# Patient Record
Sex: Female | Born: 1935 | Race: White | Hispanic: No | Smoking: Never smoker
Health system: Southern US, Community
[De-identification: ages and names within clinical notes are randomized; demographics above are authoritative.]

## PROBLEM LIST (undated history)

## (undated) DIAGNOSIS — E119 Type 2 diabetes mellitus without complications: Secondary | ICD-10-CM

## (undated) DIAGNOSIS — I4891 Unspecified atrial fibrillation: Secondary | ICD-10-CM

## (undated) DIAGNOSIS — I10 Essential (primary) hypertension: Secondary | ICD-10-CM

## (undated) DIAGNOSIS — K219 Gastro-esophageal reflux disease without esophagitis: Secondary | ICD-10-CM

## (undated) DIAGNOSIS — M13811 Other specified arthritis, right shoulder: Secondary | ICD-10-CM

## (undated) DIAGNOSIS — I679 Cerebrovascular disease, unspecified: Secondary | ICD-10-CM

## (undated) DIAGNOSIS — I2510 Atherosclerotic heart disease of native coronary artery without angina pectoris: Secondary | ICD-10-CM

## (undated) DIAGNOSIS — E785 Hyperlipidemia, unspecified: Secondary | ICD-10-CM

## (undated) DIAGNOSIS — I429 Cardiomyopathy, unspecified: Secondary | ICD-10-CM

## (undated) HISTORY — DX: Hyperlipidemia, unspecified: E78.5

## (undated) HISTORY — DX: Cerebrovascular disease, unspecified: I67.9

## (undated) HISTORY — PX: CAROTID STENT INSERTION: SHX5766

## (undated) HISTORY — PX: CHOLECYSTECTOMY: SHX55

## (undated) HISTORY — DX: Gastro-esophageal reflux disease without esophagitis: K21.9

## (undated) HISTORY — DX: Atherosclerotic heart disease of native coronary artery without angina pectoris: I25.10

## (undated) HISTORY — DX: Essential (primary) hypertension: I10

## (undated) HISTORY — PX: CAROTID ENDARTERECTOMY: SUR193

## (undated) HISTORY — DX: Cardiomyopathy, unspecified: I42.9

## (undated) HISTORY — PX: APPENDECTOMY: SHX54

## (undated) HISTORY — PX: CARDIAC CATHETERIZATION: SHX172

---

## 2005-01-14 ENCOUNTER — Ambulatory Visit (HOSPITAL_COMMUNITY): Admission: RE | Admit: 2005-01-14 | Discharge: 2005-01-14 | Payer: Self-pay | Admitting: Internal Medicine

## 2005-01-14 ENCOUNTER — Encounter (INDEPENDENT_AMBULATORY_CARE_PROVIDER_SITE_OTHER): Payer: Self-pay | Admitting: Internal Medicine

## 2005-01-14 ENCOUNTER — Ambulatory Visit: Payer: Self-pay | Admitting: Internal Medicine

## 2008-01-26 ENCOUNTER — Ambulatory Visit: Payer: Self-pay | Admitting: Cardiology

## 2008-01-28 ENCOUNTER — Inpatient Hospital Stay (HOSPITAL_COMMUNITY): Admission: EM | Admit: 2008-01-28 | Discharge: 2008-01-28 | Payer: Self-pay | Admitting: Cardiology

## 2008-01-28 ENCOUNTER — Ambulatory Visit: Payer: Self-pay | Admitting: Cardiology

## 2008-02-17 ENCOUNTER — Ambulatory Visit: Payer: Self-pay | Admitting: Cardiology

## 2008-12-21 DIAGNOSIS — E785 Hyperlipidemia, unspecified: Secondary | ICD-10-CM | POA: Insufficient documentation

## 2008-12-21 DIAGNOSIS — R079 Chest pain, unspecified: Secondary | ICD-10-CM | POA: Insufficient documentation

## 2008-12-21 DIAGNOSIS — I2510 Atherosclerotic heart disease of native coronary artery without angina pectoris: Secondary | ICD-10-CM | POA: Insufficient documentation

## 2009-03-08 ENCOUNTER — Encounter: Payer: Self-pay | Admitting: Physician Assistant

## 2009-03-08 ENCOUNTER — Ambulatory Visit: Payer: Self-pay | Admitting: Cardiology

## 2009-03-08 DIAGNOSIS — I6529 Occlusion and stenosis of unspecified carotid artery: Secondary | ICD-10-CM | POA: Insufficient documentation

## 2009-05-15 ENCOUNTER — Telehealth (INDEPENDENT_AMBULATORY_CARE_PROVIDER_SITE_OTHER): Payer: Self-pay | Admitting: *Deleted

## 2009-05-15 DIAGNOSIS — R002 Palpitations: Secondary | ICD-10-CM | POA: Insufficient documentation

## 2009-05-16 ENCOUNTER — Ambulatory Visit: Payer: Self-pay | Admitting: Cardiology

## 2009-05-24 ENCOUNTER — Encounter: Payer: Self-pay | Admitting: Cardiology

## 2009-05-25 ENCOUNTER — Telehealth (INDEPENDENT_AMBULATORY_CARE_PROVIDER_SITE_OTHER): Payer: Self-pay | Admitting: *Deleted

## 2009-06-01 ENCOUNTER — Ambulatory Visit: Payer: Self-pay | Admitting: Cardiology

## 2009-06-01 DIAGNOSIS — I471 Supraventricular tachycardia: Secondary | ICD-10-CM | POA: Insufficient documentation

## 2009-06-12 ENCOUNTER — Telehealth (INDEPENDENT_AMBULATORY_CARE_PROVIDER_SITE_OTHER): Payer: Self-pay | Admitting: *Deleted

## 2010-01-03 ENCOUNTER — Encounter (INDEPENDENT_AMBULATORY_CARE_PROVIDER_SITE_OTHER): Payer: Self-pay | Admitting: *Deleted

## 2010-04-09 NOTE — Procedures (Signed)
Summary: Holter Monitor-Final Summary  Holter Monitor-Final Summary   Imported By: Cyril Loosen, RN, BSN 05/29/2009 16:28:50  _____________________________________________________________________  External Attachment:    Type:   Image     Comment:   External Document  Appended Document: Holter Monitor-Final Summary Dr. Diona Browner, Based on these results, do you feel pt needs further follow up?  Appended Document: Holter Monitor-Final Summary Yes - we can discuss her palpitations and decide if further medication adjustments are needed.  Appended Document: Holter Monitor-Final Summary Pt notified of results and verbalized understanding. Scheduled pt on Friday, March 25th at 2pm.

## 2010-04-09 NOTE — Assessment & Plan Note (Signed)
Summary: PALPS-DISCUSS HOLTER RESULTS-JM   Visit Type:  Follow-up Primary Provider:  Dr. Lia Hopping   History of Present Illness: 76 year old woman presents for a followup visit. She had called recently reporting an increase in palpitations and was referred for a Holter monitor. This study was reviewed, revealing occasional PVCs and PACs, no NSVT, no pauses, but some bursts of PSVT were noted at approximately 160 beats per minute, the longest being 19 beats.  Today were reviewed these findings. She seems somewhat less bothered by her palpitations, although definitely indicates that they are worse than they used to be. She states she was given a medication in the past to take as needed with palpitations, but cannot recall the name.  We talked about PSVT, and strategies for management, particularly with fairly brief, limited episodes. She is not reporting any significant chest pain or unusual breathlessness.  Preventive Screening-Counseling & Management  Alcohol-Tobacco     Smoking Status: never  Current Medications (verified): 1)  Atenolol-Chlorthalidone 50-25 Mg Tabs (Atenolol-Chlorthalidone) .... Take 1 Tablet By Mouth Once A Day 2)  Multivitamins  Tabs (Multiple Vitamin) .... Take 1 Tablet By Mouth Once A Day 3)  Aspir-Low 81 Mg Tbec (Aspirin) .... Take 1 Tablet By Mouth Once A Day 4)  Fish Oil 1000 Mg Caps (Omega-3 Fatty Acids) .... Take 1 Capsule By Mouth Three Times A Day  Allergies: 1)  ! Codeine  Comments:  Nurse/Medical Assistant: The patient's medications were reviewed with the patient and were updated in the Medication List. Pt verbally confirmed medications.  Cyril Loosen, RN, BSN (June 01, 2009 2:13 PM)  Past History:  Past Medical History: Last updated: 03/07/2009 Cerebrovascular disease (left CEA, followed at [facility]) CAD - minimal nonobsructive, LVEF 65% Hyperlipidemia G E R D Colonic polys and diverticula  Social History: Last updated: 03/07/2009 Tobacco  Use - No Alcohol Use - no  Clinical Review Panels:  Cardiac Imaging Cardiac Cath Findings RESULTS:  The Left main coronary artery:  The left main coronary artery   was free of significant disease.      The left anterior descending artery:  The left anterior descending   artery had some mild narrowing of 30% in the proximal LAD, but the rest   of the vessel was clean.  The LAD gave rise to a large diagonal branch   and a septal perforator and then several small septal perforators and   diagonal branches.      The circumflex artery:  The circumflex artery was a fairly large   codominant vessel and gave rise to a large marginal branch and a small   posterolateral branch and a posterior descending branch.  This was a   dominant vessel.  These vessels were free of significant disease.      The right coronary artery:  The right coronary artery was a small   nondominant vessel that supplied a small conus branch and 2 right   ventricular branches.  These vessels were free of significant disease.      The left ventriculogram:  The left ventriculogram performed on the RAO   projection showed good wall motion with no areas of hypokinesis.  The   estimated ejection fraction was 65%. (01/28/2008)    Review of Systems  The patient denies anorexia, fever, chest pain, syncope, dyspnea on exertion, peripheral edema, headaches, hemoptysis, melena, and hematochezia.         Otherwise reviewed and negative except as outlined above.  Vital Signs:  Patient profile:  76 year old female Height:      67 inches Weight:      164.50 pounds BMI:     25.86 Pulse rate:   61 / minute BP sitting:   107 / 66  (left arm) Cuff size:   regular  Vitals Entered By: Cyril Loosen, RN, BSN (June 01, 2009 2:11 PM)  Nutrition Counseling: Patient's BMI is greater than 25 and therefore counseled on weight management options. Comments Follow up on holter monitor d/t palpitations   Physical  Exam  Additional Exam:  Normally nourished appearing woman in no acute distress. HEENT: Conjunctiva and lids normal, oropharynx clear. NECK: No carotid bruits or thyromegaly. LUNGS: CTA bilaterally. CARDIAC: RRR (S1,S2). No significant murmurs. No rubs, gallops. ABDOMEN: Soft, nontender. Intact BS. EXTREMETIES: Palpable femoral pulses, no bruits; 2+ distal pulses, no edema.    Impression & Recommendations:  Problem # 1:  PSVT (ICD-427.0)  Patient with history of intermittent palpitations, and Holter monitor documenting recent episodes of PSVT, the longest lasting 19 beats. These were cycling about 160 beats per minute. She has had no dizziness or syncope. At this point we plan to continue her present dose of atenolol and use propranolol 20 mg p.o. t.i.d. to q.i.d. p.r.n., particularly if she manifests progressive or more frequent episodes. We will plan to bring her back over the next 6 months to discuss the situation.  Her updated medication list for this problem includes:    Atenolol-chlorthalidone 50-25 Mg Tabs (Atenolol-chlorthalidone) .Marland Kitchen... Take 1 tablet by mouth once a day    Aspir-low 81 Mg Tbec (Aspirin) .Marland Kitchen... Take 1 tablet by mouth once a day    Propranolol Hcl 20 Mg Tabs (Propranolol hcl) .Marland Kitchen... Take one tablet by mouth three times a day to four times a day as needed palpitations.  Problem # 2:  CAD, NATIVE VESSEL (ICD-414.01)  History of minor, nonobstructive atherosclerosis, without angina or breathlessness at this time.  Her updated medication list for this problem includes:    Atenolol-chlorthalidone 50-25 Mg Tabs (Atenolol-chlorthalidone) .Marland Kitchen... Take 1 tablet by mouth once a day    Aspir-low 81 Mg Tbec (Aspirin) .Marland Kitchen... Take 1 tablet by mouth once a day    Propranolol Hcl 20 Mg Tabs (Propranolol hcl) .Marland Kitchen... Take one tablet by mouth three times a day to four times a day as needed palpitations.  Patient Instructions: 1)  Your physician wants you to follow-up in: 6 months. You  will receive a reminder letter in the mail one-two months in advance. If you don't receive a letter, please call our office to schedule the follow-up appointment. 2)  Take propranolol 20mg  three times a day to four times a day as needed palpitations.  Prescriptions: PROPRANOLOL HCL 20 MG TABS (PROPRANOLOL HCL) Take one tablet by mouth three times a day to four times a day as needed palpitations.  #90 x 3   Entered by:   Cyril Loosen, RN, BSN   Authorized by:   Loreli Slot, MD, [facility]   Signed by:   Cyril Loosen, RN, BSN on 06/01/2009   Method used:   Electronically to        [facility]* (retail)       [address]       [place], [place]  [address]       Ph: [phone]       Fax: [phone]   RxID:   [id number]   Handout requested.

## 2010-04-09 NOTE — Letter (Signed)
Summary: Recall, Screening Colonoscopy Only  [facility]  [address]   [place], [place] [address]   Phone: [phone]  Fax: [phone]    January 03, 2010  Jill Fisher [address] [place], [place]  [address] 30-Dec-1934   Dear Ms. [name],   Our records indicate it is time to schedule your colonoscopy.    Please call our office at [phone] and ask for the nurse.   Thank you,    Hendricks Limes, LPN Cloria Spring, LPN  [facility] Ph: [phone]   Fax: [phone]

## 2010-04-09 NOTE — Progress Notes (Signed)
Summary: PATIENT WANTING TO BE SEEN ASAP DUE TO PALPS  Phone Note Call from Patient Call back at Home Phone [phone]   Caller: PALPS Reason for Call: Talk to Nurse Details for Reason: PALPS Summary of Call: PATIENT CALLED WANTING TO MAKE AN APPOINTMENT TO SEE MCDOW[facility] THIS WEEK DUE TO HAVING RAPID HEARTBEAT DURING THE NIGHT THAT WAKES HER UP.  SHE STATED THAT THIS HAS BEEN GOING ON FOR THE LAST 3-5 DAYS.  I OFFERED HER AN APPOINTMENT WITH MCDOWELL THIS FRIDAY 05/18/09 @ 2:30, BUT SHE REFUSED APPOINTMENT SAID IT WASN'T SOON ENOUGH THAT THIS WAS A VERY SERIOUS FOR HER.  I THEN ADVISED HER TO GO THE EMERGENCY ROOM IF SHE FELT SHE NEEDED IMMEDIATE TREATMENT DUE TO MCDOWELL NOT BEING IN THE OFFICE UNTIL FRIDAY. SHE THEN ASKED TO SPEAK WITH HIS NURSE THIS AFTERNOON UPON HER RETURN. Initial call taken by: Claudette Laws,  May 15, 2009 12:56 PM  Follow-up for Phone Call        Pt states she is having irregular heartbeats. She states her heart skips beats frequently. She states it usually occurs at night and sometimes wakes her up from sleep. She is concerned b/c it is happening so frequently (sometimes every other beat per pt). She denies any other symptoms or problems but is very concerned about her heart skipping beats. She states her primary MD started her on a new cholesterol medication. She thought this was related to that medication. Her primary MD told her to stop the medication, which she did, but she hasn't noticed any improvement. Medications as listed in EMR are correct.  Follow-up by: Cyril Loosen, RN, BSN,  May 15, 2009 3:30 PM  Additional Follow-up for Phone Call Additional follow up Details #1::        Reviewed notes.  No history of cardiac arrhythmias and only minimal atherosclerosis at catheterization with normal LVEF.  Could place a 48 hour Holter if she is having frequent symptoms to better document what she is feeling.  Can see her Friday when back in clinic. Additional  Follow-up by: Loreli Slot, MD, [facility],  May 15, 2009 3:34 PM  New Problems: PALPITATIONS (ICD-785.1)   Additional Follow-up for Phone Call Additional follow up Details #2::    Pt will have Holter placed tomorrow am at 8:30. We will discuss then to decide if she wants appt for Friday with Dr. Diona Browner.  New Problems: PALPITATIONS (ICD-785.1)

## 2010-04-09 NOTE — Progress Notes (Signed)
Summary: Wants holter results  Phone Note Call from Patient Call back at Home Phone [phone]   Summary of Call: Pt left message on my voicemail asking if results of Holter were available. Pt notified that result has been received. Dr. Diona Browner is out of the office this week. Once he reviewed this result, she will be notified. Pt verbalized understanding.  Initial call taken by: Cyril Loosen, RN, BSN,  May 25, 2009 3:24 PM

## 2010-04-09 NOTE — Assessment & Plan Note (Signed)
Summary: 6 mof u per dec reminder-srs   Visit Type:  Follow-up Primary Provider:  Lia Hopping, MD   History of Present Illness: 76 year old female with history of minimal CAD by cardiac catheterization in November 2009, by Dr. Charlies Constable, after presenting to [place] with an abnormal stress Myoview suggestive of anterior apical ischemia. Dr. Juanda Chance concluded that her chest discomfort was noncardiac in origin. LV function was normal.  When last seen in the clinic, she was placed on pravastatin for aggressive lipid management. She informs me today that she was not able to tolerate this, as had been the case in the past with both simvastatin and atorvastatin. Her myalgia promptly resolved at, after she discontinued the pravastatin. She is on Omega 3 fish oil, and has close monitoring of her lipid status, by Dr. Olena Leatherwood.  Clinically, she denies any interim development of exertional angina pectoris. She has also never smoked tobacco.  Current Medications (verified): 1)  Atenolol-Chlorthalidone 50-25 Mg Tabs (Atenolol-Chlorthalidone) .... Take 1 Tablet By Mouth Once A Day 2)  Multivitamins  Tabs (Multiple Vitamin) .... Take 1 Tablet By Mouth Once A Day 3)  Aspir-Low 81 Mg Tbec (Aspirin) .... Take 1 Tablet By Mouth Once A Day  Allergies (verified): 1)  ! Codeine  Comments:  Nurse/Medical Assistant: The patient's medications and allergies were reviewed with the patient and were updated in the Medication and Allergy Lists. Brought bottle to office.  Past History:  Past Medical History: Last updated: 03/07/2009 Cerebrovascular disease (left CEA, followed at [facility]) CAD - minimal nonobsructive, LVEF 65% Hyperlipidemia G E R D Colonic polys and diverticula   Review of Systems       No fevers, chills, hemoptysis, dysphagia, melena, hematocheezia, hematuria, rash, claudication, orthopnea, pnd, pedal edema. All other systems negative.   Vital Signs:  Patient profile:   76 year old  female Height:      67 inches Weight:      164 pounds BMI:     25.78 Pulse rate:   65 / minute BP sitting:   116 / 67  (left arm) Cuff size:   regular  Vitals Entered By: Carlye Grippe (March 08, 2009 2:19 PM)  Nutrition Counseling: Patient's BMI is greater than 25 and therefore counseled on weight management options.  Physical Exam  Additional Exam:  GENERAL: 76 year old female, sitting upright, in no distress. HEENT: [place], AT. PERRLA, EOMI. NECK: Palpable carotid pulses, no bruits; no JVD; no thyromegaly. LUNGS: CTA bilaterally. CARDIAC: RRR (S1,S2). No significant murmurs. No rubs, gallops. ABDOMEN: Soft, nontender. Intact BS. EXTREMETIES: Palpable femoral pulses, no bruits; 2+ distal pulses, no edema. MUSCULOSKELETAL: No obvious deformity. SKIN: warm, dry. no obvious rashes. NEURO: Alert and oriented. No focal deficit.    EKG  Procedure date:  03/08/2009  Findings:      NSR 62 bpm; normal axis; no ischemic changes.  Impression & Recommendations:  Problem # 1:  CHEST PAIN-UNSPECIFIED (ICD-786.50) patient returns for annual followup, denying any interim development of signs or symptoms suggestive of unstable angina pectoris. Of note, she does take Pepcid p.r.n., for symptoms suggestive of reflux disease. Therefore, no further workup is indicated. Will have her return to Dr. Simona Huh, on an as needed basis.  Problem # 2:  CAD, NATIVE VESSEL (ICD-414.01)  pt underwent coronary angiography in November 2009, by Dr. Charlies Constable, following an abnormal stress Myoview suggestive of anteroapical ischemia. She was found to have minimal CAD, with 30% proximal LAD stenosis, and normal LV function. Dr. Juanda Chance concluded  that her symptoms were noncardiac in etiology. Therefore, no further cardiac workup is indicated, at present time. Patient will return to [place] on an as-needed basis.  Problem # 3:  HYPERLIPIDEMIA-MIXED (ICD-272.4)  patient reports intolerance to numerous statins,  most recently pravastatin. She has tried both simvastatin and atorvastatin in the past. She is currently on Omega 3 fish old. I instructed her to monitor this closely, in conjunction with Dr. Olena Leatherwood, and to try to maintain her LDL level at 70 or below, if possible.  Problem # 4:  CAROTID ARTERY DISEASE (ICD-433.10)  patient is status post left carotid endarterectomy, followed closely by her team at [facility].  Other Orders: EKG w/ Interpretation (93000)  Patient Instructions: 1)  Your physician recommends that you continue on your current medications as directed. Please refer to the Current Medication list given to you today. 2)  Follow up as needed.

## 2010-04-09 NOTE — Assessment & Plan Note (Signed)
Summary: Holter  Nurse Visit  Comments 48 hour holter monitor placed for palpitations. Pt given the option of being seen Friday for evaluatio or waiting until holter results are reviewed. Pt notified, in this case, we will review holter results and notify her if an appt is needed. She chose this option stating there is no point in being seen on Friday if we will not have the holter results.    Allergies: 1)  ! Codeine

## 2010-04-09 NOTE — Progress Notes (Signed)
Summary: Propranolol causing low HR  Phone Note Call from Patient Call back at [facility] Phone [phone]   Summary of Call: Pt states the propranolol is bringing her HR too low. She states she "can hardly go." She states palpitation are worse now than ever. She states HR was 22 last night. She took her pulse herself. She said it seemed like she was skipping about every other beat. She states she felt "BAD" like she couldn't do anything. She states she took one this morning and this afternoon and her heart rate has been okay today. She states she has been taking this two times a day instead three times a day or four times a day. Notified pt prescription was written three times a day-four times a day as needed palpitations. Pt states she thought it was meant to be as needed but the handout she received with this medication said to take it every day and do not miss doses or it could cause chest pain or a heart attack. Notified pt to take this only as needed. Notified pt I would have Dr. Diona Browner review this note and notify further. Pt verbalized understanding.  Initial call taken by: Cyril Loosen, RN, BSN,  June 12, 2009 4:47 PM  Follow-up for Phone Call        Reviewed discussion.  Propranolol is to be used as needed as stated in my office note.  If she continues to describe "slow" heart rates then would try and document this with 24 hour Holter or ECG in office.  Expect she may be undercounting true heart rate in setting of increased ectopy, as with bigeminy. Follow-up by: Loreli Slot, MD, [facility],  June 13, 2009 10:46 AM  Additional Follow-up for Phone Call Additional follow up Details #1::        Pt notified. Pt verbalized understanding.  Additional Follow-up by: Cyril Loosen, RN, BSN,  June 13, 2009 11:42 AM

## 2010-07-23 NOTE — Assessment & Plan Note (Signed)
[facility]                          [facility] OFFICE NOTE   NAME:Fisher, Jill CIRCLE                        MRN:          [id number]  DATE:02/17/2008                            DOB:          20-Oct-1934    CARDIOLOGIST:  Jonelle Sidle, MD   PRIMARY CARE PHYSICIAN:  Dr. Lia Fisher   REASON FOR VISIT:  Post-hospitalization followup.   HISTORY OF PRESENT ILLNESS:  Jill Fisher is a 76 year old female patient  who was recently evaluated at [facility] with chest pain  concerning for angina.  She underwent Cardiolite study that had an  increased TID ratio concerning for balanced ischemia and mild reversible  anterior apical defect.  She was transferred to [facility] for  cardiac catheterization that demonstrated minimal nonobstructive  coronary artery disease with 30% narrowing in the proximal LAD.  Her LV  function was normal.  She was placed on a proton pump inhibitor for  possible reflux as a cause for her symptoms.  In the office today, she  notes no further episodes of chest discomfort or shortness of breath.  She denies any syncope or near syncope.  In looking through her  information, she had lipids done at [facility] that demonstrated  a triglyceride level of 194, total cholesterol 219, HDL 45, and LDL of  135.  The patient notes that she took a statin many years ago that  caused some arthralgias.  She cannot recall the name.   CURRENT MEDICATIONS:  1. Multivitamin daily.  2. Atenolol/chlorthalidone 50/25 mg daily.  3. Omeprazole 20 mg daily.  4. Aspirin 81 mg daily.  5. Tylenol p.r.n.   PHYSICAL EXAMINATION:  GENERAL:  She is a well nourished, well developed  female, in no acute distress.  VITAL SIGNS:  Blood pressure is 114/66, pulse 63, and weight 166.4  pounds.  HEENT:  Normal.  NECK:  Without JVD.  CARDIAC:  Normal S1 and S2.  Regular rate and rhythm.  LUNGS:  Clear to auscultation bilaterally.  ABDOMEN:   Soft and nontender.  EXTREMITIES:  Without edema.  NEUROLOGIC:  She is alert and oriented x3.  Cranial II through XII are  grossly intact.  VASCULAR:  Right femoral arteriotomy site without hematoma or bruit.   ASSESSMENT AND PLAN:  1. Noncardiac chest pain.  The patient has had resolution of her      symptoms on proton pump inhibitor therapy.  She is obtaining      omeprazole over the counter.  I have recommend she continue this      for at least 4-6 weeks and taper off of the proton pump inhibitor      when she completes her course.  She can take over-the-counter H2      blockers after that.  She should follow up with Dr. Olena Fisher for      further recommendations, if any.  2. Minimal coronary artery disease, as outlined above.  She will      continue on aspirin therapy.  3. Dyslipidemia.  With her history of minimal coronary artery disease  and cerebrovascular disease status post left carotid      endarterectomy, her goal LDL is really less than or equal to 70.  I      have recommended pravastatin 20 mg QHS.  She is willing to try      this.  If she develops arthralgias or myalgias, she will contact      [place].  Otherwise, we will recheck lipids and LFTs in 12 weeks.  4. Cerebrovascular disease, status post left carotid endarterectomy.      She continues to follow up with [facility] with annual      carotid Dopplers.   DISPOSITION:  The patient will be brought back in followup with Dr.  Diona Fisher in 1 year or sooner p.r.n.      Tereso Newcomer, PA-C  Electronically Signed      Jonelle Sidle, MD  Electronically Signed   SW/MedQ  DD: 02/17/2008  DT: 02/18/2008  Job #: [id number]   cc:   Jill Fisher

## 2010-07-23 NOTE — Discharge Summary (Signed)
NAMETEREA, NEUBAUER                 ACCOUNT NO.:  [id number]   MEDICAL RECORD NO.:  [id number]          PATIENT TYPE:  INP   LOCATION:  2854                         FACILITY:  [facility]   PHYSICIAN:  Bruce R. Juanda Chance, MD, FACCDATE OF BIRTH:  1934/06/06   DATE OF ADMISSION:  01/28/2008  DATE OF DISCHARGE:  02/04/2008                               DISCHARGE SUMMARY   PRIMARY CARDIOLOGIST:  Jonelle Sidle, MD.   PRIMARY CARE Roby Donaway:  Dr. Lia Hopping.   DISCHARGE DIAGNOSIS:  Chest pain.   SECONDARY DIAGNOSES:  1. Hypertension.  2. Peripheral vascular disease, status post left carotid      endarterectomy.   ALLERGIES:  CODEINE.   PROCEDURE:  Left heart rate catheterization.   HISTORY OF PRESENT ILLNESS:  A 76 year old Caucasian female without  prior cardiac history who was in her usual state of health until this  past Saturday, November 22, 2007, when she began to experience  intermittent chest heaviness, worse in the evening hours.  She did  receive some relief with Tums and aspirin; however, had recurrent  symptoms with exertion.  This prompted admission to [facility] on  January 26, 2008, following which she underwent an adenosine Myoview on  January 27, 2008, revealing a mild reversible anteroapical defect in  the setting of breast attenuation.  EF was 77%.  There is some concern  with regards to the possibility of balanced ischemia and decision was  made to pursue left heart cardiac catheterization.  The patient was  transferred to [facility].   HOSPITAL COURSE:  The patient arrived to the [facility] today and  underwent left heart cardiac catheterization revealing normal coronary  arteries, normal LV function.  She has had no recurrent chest  discomfort.  We will plan to discharge her home tonight.  We have  recommended initiation of proton pump inhibitor therapy.   DISCHARGE LABS:  She has had no labs at [facility].   DISPOSITION:  The patient will  be discharged home today in good  condition.   FOLLOWUP APPOINTMENTS:  We have asked her to follow up with Dr. Diona Browner  in approximately 2 weeks and with Dr. Olena Leatherwood as previously scheduled.   DISCHARGE MEDICATIONS:  1. Aspirin 81 mg daily.  2. Atenolol/chlorthalidone 50/25 mg daily.  3. Multivitamin one daily.  4. K-Dur 20 mEq b.i.d.  5. Prilosec OTC 20 mg daily.   OUTSTANDING LABORATORY STUDIES:  None.   DURATION OF DISCHARGE ENCOUNTER:  35 minutes including physician time.      Nicolasa Ducking, ANP      Bruce R. Juanda Chance, MD, [facility]  Electronically Signed    CB/MEDQ  D:  01/28/2008  T:  01/29/2008  Job:  [id number]   cc:   Lia Hopping

## 2010-07-23 NOTE — Cardiovascular Report (Signed)
NAMEYESSIKA, OTTE                 ACCOUNT NO.:  [id number]   MEDICAL RECORD NO.:  [id number]          PATIENT TYPE:  INP   LOCATION:  2854                         FACILITY:  [facility]   PHYSICIAN:  Bruce R. Juanda Chance, MD, FACCDATE OF BIRTH:  Jan 13, 1935   DATE OF PROCEDURE:  01/28/2008  DATE OF DISCHARGE:  01/28/2008                            CARDIAC CATHETERIZATION   CLINICAL HISTORY:  Ms. Wareing is 76 years old and developed a chest pain  which she describes as tightness with exertion over the last couple of  days.  She was admitted to [facility] and she underwent a Myoview  scan which suggested anteroapical ischemia.  For this reason, she was  transferred here for evaluation with angiography.   PROCEDURE:  The procedure was performed by the right femoral artery,  arterial sheath, and 5-French preformed coronary catheters.  A front  wall arterial puncture was performed and Omnipaque contrast was used.  The patient tolerated the procedure well and left the laboratory in  satisfactory condition.   RESULTS:  The Left main coronary artery:  The left main coronary artery  was free of significant disease.   The left anterior descending artery:  The left anterior descending  artery had some mild narrowing of 30% in the proximal LAD, but the rest  of the vessel was clean.  The LAD gave rise to a large diagonal branch  and a septal perforator and then several small septal perforators and  diagonal branches.   The circumflex artery:  The circumflex artery was a fairly large  codominant vessel and gave rise to a large marginal branch and a small  posterolateral branch and a posterior descending branch.  This was a  dominant vessel.  These vessels were free of significant disease.   The right coronary artery:  The right coronary artery was a small  nondominant vessel that supplied a small conus branch and 2 right  ventricular branches.  These vessels were free of significant disease.   The left ventriculogram:  The left ventriculogram performed on the RAO  projection showed good wall motion with no areas of hypokinesis.  The  estimated ejection fraction was 65%.   CONCLUSION:  Minimal nonobstructive coronary artery disease with 30%  narrowing in the proximal LAD, no significant obstruction in circumflex  and right coronary artery and normal LV function.   RECOMMENDATIONS:  In view of these findings, I think the patient's  recent symptoms are not cardiac related.  As she did lift some  furniture, it is possible she could have pulled a muscle.  She also has  had symptoms of heartburn and indigestion and the symptoms could be  related to reflux.  We will plan to treat her reflux and give her some  time.  We will discharge in the morning with followup with Dr. Diona Browner.      Bruce Elvera Lennox Juanda Chance, MD, [facility]  Electronically Signed     BRB/MEDQ  D:  01/28/2008  T:  01/29/2008  Job:  [id number]   cc:   Jonelle Sidle, MD  Lia Hopping

## 2010-07-26 NOTE — Op Note (Signed)
[name], [name]                 ACCOUNT NO.:  [id number]   MEDICAL RECORD NO.:  [id number]          PATIENT TYPE:  AMB   LOCATION:  DAY                           FACILITY:  [facility]   PHYSICIAN:  Lionel December, M.D.    DATE OF BIRTH:  04/28/1934   DATE OF PROCEDURE:  01/14/2005  DATE OF DISCHARGE:                                 OPERATIVE REPORT   PROCEDURE:  Colonoscopy.   INDICATIONS:  Jill Fisher is 76 year old Caucasian female who is here for  screening colonoscopy. Family history is positive for colon carcinoma in a  brother who developed disease in his 1s. Procedure and risks were reviewed  with the patient, and informed consent was obtained.   PREMEDICATION:  Demerol 40 mg IV, Versed 3 mg IV in divided dose.   FINDINGS:  Procedure performed in endoscopy suite. The patient's vital signs  and O2 saturation were monitored during the procedure and remained stable.  The patient was placed in left lateral position and rectal examination  performed. No abnormality noted on external or digital exam. Olympus  videoscope was placed in rectum and advanced under vision into sigmoid colon  and beyond. Preparation was excellent. She had multiple diverticula at  sigmoid colon with few more at descending colon. Scope was passed into cecum  which was identified by appendiceal stump and ileocecal valve. Pictures  taken for the record. As the scope was withdrawn, colonic mucosa was  carefully examined. There was a single small polyp at ascending colon which  was ablated via cold biopsy. Mucosa rest of the colon was normal. Rectal  mucosa was also normal. Scope was retroflexed to examine anorectal junction  which was unremarkable. Endoscope was straightened and withdrawn. The  patient tolerated the procedure well.   FINAL DIAGNOSIS:  1.  Small polyp ablated via cold biopsy from the ascending colon.  2.  Multiple diverticula at sigmoid colon with few more at descending colon.   RECOMMENDATIONS:  1.  High-fiber diet.  2.  Citrucel 4 grams q.d.  3.  I will be contacting the patient with biopsy results. Given positive      family history, she will need to undergo repeat exam in five years from      now.      Lionel December, M.D.  Electronically Signed     NR/MEDQ  D:  01/14/2005  T:  01/14/2005  Job:  [id number]   cc:   Lia Hopping  Fax: [phone]

## 2011-10-03 ENCOUNTER — Encounter: Payer: Self-pay | Admitting: *Deleted

## 2011-10-03 DIAGNOSIS — I679 Cerebrovascular disease, unspecified: Secondary | ICD-10-CM | POA: Insufficient documentation

## 2011-10-03 DIAGNOSIS — K219 Gastro-esophageal reflux disease without esophagitis: Secondary | ICD-10-CM | POA: Insufficient documentation

## 2012-03-05 ENCOUNTER — Emergency Department (HOSPITAL_COMMUNITY)
Admission: EM | Admit: 2012-03-05 | Discharge: 2012-03-05 | Disposition: A | Discharge status: Home / Self Care | Payer: Medicare Other | Attending: Emergency Medicine | Admitting: Emergency Medicine

## 2012-03-05 ENCOUNTER — Encounter (HOSPITAL_COMMUNITY): Payer: Self-pay | Admitting: *Deleted

## 2012-03-05 ENCOUNTER — Emergency Department (HOSPITAL_COMMUNITY): Payer: Medicare Other

## 2012-03-05 DIAGNOSIS — R4789 Other speech disturbances: Secondary | ICD-10-CM | POA: Insufficient documentation

## 2012-03-05 DIAGNOSIS — R42 Dizziness and giddiness: Secondary | ICD-10-CM | POA: Insufficient documentation

## 2012-03-05 DIAGNOSIS — Z79899 Other long term (current) drug therapy: Secondary | ICD-10-CM | POA: Insufficient documentation

## 2012-03-05 DIAGNOSIS — Z9089 Acquired absence of other organs: Secondary | ICD-10-CM | POA: Insufficient documentation

## 2012-03-05 DIAGNOSIS — Z9889 Other specified postprocedural states: Secondary | ICD-10-CM | POA: Insufficient documentation

## 2012-03-05 DIAGNOSIS — R279 Unspecified lack of coordination: Secondary | ICD-10-CM | POA: Insufficient documentation

## 2012-03-05 DIAGNOSIS — R209 Unspecified disturbances of skin sensation: Secondary | ICD-10-CM | POA: Insufficient documentation

## 2012-03-05 DIAGNOSIS — I2510 Atherosclerotic heart disease of native coronary artery without angina pectoris: Secondary | ICD-10-CM | POA: Insufficient documentation

## 2012-03-05 DIAGNOSIS — Z8719 Personal history of other diseases of the digestive system: Secondary | ICD-10-CM | POA: Insufficient documentation

## 2012-03-05 DIAGNOSIS — I679 Cerebrovascular disease, unspecified: Secondary | ICD-10-CM | POA: Insufficient documentation

## 2012-03-05 DIAGNOSIS — R202 Paresthesia of skin: Secondary | ICD-10-CM

## 2012-03-05 DIAGNOSIS — E785 Hyperlipidemia, unspecified: Secondary | ICD-10-CM | POA: Insufficient documentation

## 2012-03-05 DIAGNOSIS — R61 Generalized hyperhidrosis: Secondary | ICD-10-CM | POA: Insufficient documentation

## 2012-03-05 LAB — URINE MICROSCOPIC-ADD ON

## 2012-03-05 LAB — LIPID PANEL
Cholesterol: 185 mg/dL (ref 0–200)
HDL: 61 mg/dL (ref >39)
LDL Cholesterol: 98 mg/dL (ref 0–99)
Total CHOL/HDL Ratio: 3 RATIO
Triglycerides: 128 mg/dL (ref <150)
VLDL: 26 mg/dL (ref 0–40)

## 2012-03-05 LAB — CBC WITH DIFFERENTIAL/PLATELET
Basophils Absolute: 0.1 10*3/uL (ref 0.0–0.1)
Basophils Relative: 1 % (ref 0–1)
Eosinophils Absolute: 0.4 10*3/uL (ref 0.0–0.7)
Eosinophils Relative: 5 % (ref 0–5)
HCT: 39.7 % (ref 36.0–46.0)
Hemoglobin: 13.7 g/dL (ref 12.0–15.0)
Lymphocytes Relative: 37 % (ref 12–46)
Lymphs Abs: 2.8 10*3/uL (ref 0.7–4.0)
MCH: 28.6 pg (ref 26.0–34.0)
MCHC: 34.5 g/dL (ref 30.0–36.0)
MCV: 82.9 fL (ref 78.0–100.0)
Monocytes Absolute: 0.6 10*3/uL (ref 0.1–1.0)
Monocytes Relative: 8 % (ref 3–12)
Neutro Abs: 3.7 10*3/uL (ref 1.7–7.7)
Neutrophils Relative %: 49 % (ref 43–77)
Platelets: 185 10*3/uL (ref 150–400)
RBC: 4.79 MIL/uL (ref 3.87–5.11)
RDW: 13.1 % (ref 11.5–15.5)
WBC: 7.6 10*3/uL (ref 4.0–10.5)

## 2012-03-05 LAB — COMPREHENSIVE METABOLIC PANEL
ALT: 14 U/L (ref 0–35)
AST: 17 U/L (ref 0–37)
Albumin: 3.4 g/dL — ABNORMAL LOW (ref 3.5–5.2)
Alkaline Phosphatase: 61 U/L (ref 39–117)
BUN: 13 mg/dL (ref 6–23)
CO2: 28 mEq/L (ref 19–32)
Calcium: 9.3 mg/dL (ref 8.4–10.5)
Chloride: 100 mEq/L (ref 96–112)
Creatinine, Ser: 0.63 mg/dL (ref 0.50–1.10)
GFR calc Af Amer: >90 mL/min (ref >90)
GFR calc non Af Amer: 86 mL/min — ABNORMAL LOW (ref >90)
Glucose, Bld: 127 mg/dL — ABNORMAL HIGH (ref 70–99)
Potassium: 2.8 mEq/L — ABNORMAL LOW (ref 3.5–5.1)
Sodium: 140 mEq/L (ref 135–145)
Total Bilirubin: 0.6 mg/dL (ref 0.3–1.2)
Total Protein: 6.2 g/dL (ref 6.0–8.3)

## 2012-03-05 LAB — URINALYSIS, ROUTINE W REFLEX MICROSCOPIC
Bilirubin Urine: NEGATIVE
Glucose, UA: NEGATIVE mg/dL
Hgb urine dipstick: NEGATIVE
Ketones, ur: NEGATIVE mg/dL
Nitrite: NEGATIVE
Protein, ur: NEGATIVE mg/dL
Specific Gravity, Urine: 1.013 (ref 1.005–1.030)
Urobilinogen, UA: 1 mg/dL (ref 0.0–1.0)
pH: 6.5 (ref 5.0–8.0)

## 2012-03-05 LAB — HEMOGLOBIN A1C
Hgb A1c MFr Bld: 6.6 % — ABNORMAL HIGH (ref <5.7)
Mean Plasma Glucose: 143 mg/dL — ABNORMAL HIGH (ref <117)

## 2012-03-05 LAB — PROTIME-INR
INR: 1 (ref 0.00–1.49)
Prothrombin Time: 13.1 seconds (ref 11.6–15.2)

## 2012-03-05 LAB — APTT: aPTT: 30 seconds (ref 24–37)

## 2012-03-05 MED ORDER — POTASSIUM CHLORIDE CRYS ER 20 MEQ PO TBCR
40.0000 meq | EXTENDED_RELEASE_TABLET | Freq: Once | ORAL | Status: AC
  Administered 2012-03-05: 40 meq via ORAL
  Filled 2012-03-05: qty 2

## 2012-03-05 MED ORDER — DIAZEPAM 5 MG PO TABS
5.0000 mg | ORAL_TABLET | Freq: Once | ORAL | Status: AC
  Administered 2012-03-05: 5 mg via ORAL
  Filled 2012-03-05: qty 1

## 2012-03-05 NOTE — ED Notes (Signed)
Pt denies pain; pt denies numbness and tingling currently; pt denies lightheadedness and dizziness. Pt mentating appropriately.

## 2012-03-05 NOTE — ED Notes (Signed)
Pt reports 3 weeks of intermittent dizziness, numbness in (R) arm and having cold sweats.  Pt reports that she has been to [facility] multiple times with no results-had a negative CT scan there as well.  Pt reports that this morning she has not had cold sweats or numbness.  States that she has been dizzy but that has not changed from 3 weeks ago.  Pt denies pain, denies SOB, denies injury or fall.  NAD noted.  Pt ambulatory.  A/O x4.  CVA screen negative.

## 2012-03-05 NOTE — ED Notes (Signed)
Pt denies pain; pt denies numbness and tingling; pt denies dizziness and lightheadedness; pt alert and oriented; pt mentating appropriately. Pt requested something before MRI due to claustrophobia.

## 2012-03-05 NOTE — ED Provider Notes (Signed)
In CDU pending MRI to r/o stroke with symptoms of progressive UE paresthesias, now affecting both arms, and dizziness. No further symptoms in ED. MRI/MRA negative for acute processes. Will refer to neurology  Rodena Medin, PA-C 03/05/12 1644

## 2012-03-05 NOTE — ED Provider Notes (Deleted)
Medical screening examination/treatment/procedure(s) were conducted as a shared visit with non-physician practitioner(s) and myself.  I personally evaluated the patient during the encounter  Pt with multilpe episodes of dizziness and unilateral numbness over the past 1-2 weeks.  Had neg head CT in [place] 8 days ago by history.  Pt with h/o endarterectomy 8 years ago discovered on routine screening test.  Pt has had vertigo in the past but these episodes of different.  Will obtain brain MRI and MRA of neck to assess for TIA or posterior circulation  CVA or TIA.    Gavin Pound. Matvey Llanas, MD 03/05/12 1258

## 2012-03-05 NOTE — ED Provider Notes (Signed)
Medical screening examination/treatment/procedure(s) were conducted as a shared visit with non-physician practitioner(s) and myself.  I personally evaluated the patient during the encounter  Pt with multilpe episodes of dizziness and unilateral numbness over the past 1-2 weeks.  Had neg head CT in [place] 8 days ago by history.  Pt with h/o endarterectomy 8 years ago discovered on routine screening test.  Pt has had vertigo in the past but these episodes of different.  Will obtain brain MRI and MRA of neck to assess for TIA or posterior circulation  CVA or TIA.    Jill Fisher. Monifa Blanchette, MD 03/05/12 1616

## 2012-03-05 NOTE — ED Provider Notes (Addendum)
History     CSN: [id number]  Arrival date & time 03/05/12  1019   First MD Initiated Contact with Patient 03/05/12 1129      Chief Complaint  Patient presents with  . Weakness  . Dizziness    (Consider location/radiation/quality/duration/timing/severity/associated sxs/prior treatment) HPI 77 year old female presents to emergency department with chief complaint of episodes of dizziness, numbness of the hands, and hot and cold sweats.  Patient states the first occurrence happened approximately 2 weeks ago.  She woke in the middle of the night feeling hot and sweaty but her body was cold.  She was dizzy in her right arm went to sleep.  This lasted approximately 2 hours.  It resolved on its own.  She saw her primary care physician who set her up for CT scan.  Possibly one week later her the patient had another episode and went to [place] [facility] emergency department for evaluation.  Her CT scan of the head was negative at that time.  Patient had another episode last night but had bilateral hand numbness this time. She denies any unilateral weakness, difficulty, with speech ataxia.  Patient has a history of left carotid endarterectomy.  She is a family history significant for father who had a stroke.  And several brothers who had myocardial infarction in including one who died of MI at 23. Patient has a history of hypertension and hyperlipidemia as well as cerebrovascular and coronary artery disease.. she has had 2 cardiac catheterization medications without stent placement.  She is nondiabetic and she does not smoke. She is asymptomatic today. Past Medical History  Diagnosis Date  . Coronary artery disease     LVEF  65%  . Hyperlipidemia   . GERD (gastroesophageal reflux disease)   . Cerebrovascular disease     follow[facility]    Past Surgical History  Procedure Date  . Cholecystectomy   . Carotid endarterectomy     left    History reviewed. No pertinent family  history.  History  Substance Use Topics  . Smoking status: Never Smoker   . Smokeless tobacco: Not on file  . Alcohol Use: No    OB History    Grav Para Term Preterm Abortions TAB SAB Ect Mult Living                  Review of Systems Ten systems reviewed and are negative for acute change, except as noted in the HPI.   Allergies  Codeine  Home Medications   Current Outpatient Rx  Name  Route  Sig  Dispense  Refill  . ASPIRIN 81 MG PO TBEC   Oral   Take 81 mg by mouth daily. Swallow whole.         . ATENOLOL-CHLORTHALIDONE 50-25 MG PO TABS   Oral   Take 1 tablet by mouth daily.         . OCUVITE PO TABS   Oral   Take 1 tablet by mouth daily.         Marland Kitchen ONE-DAILY MULTI VITAMINS PO TABS   Oral   Take 1 tablet by mouth daily.         Marland Kitchen FISH OIL 1000 MG PO CAPS   Oral   Take by mouth 3 (three) times daily.         Marland Kitchen POTASSIUM CHLORIDE CRYS ER 10 MEQ PO TBCR   Oral   Take 10 mEq by mouth 2 (two) times daily.         Marland Kitchen  PROPRANOLOL HCL 20 MG PO TABS   Oral   Take 20 mg by mouth as directed. Taking 3 to 4 times a day as needed for heart palpitations           BP 122/55  Pulse 50  Temp 98 F (36.7 C)  Resp 18  SpO2 95%  Physical Exam  Nursing note and vitals reviewed. Constitutional: She is oriented to person, place, and time. She appears well-developed and well-nourished. No distress.  HENT:  Head: Normocephalic and atraumatic.  Eyes: Conjunctivae normal are normal. No scleral icterus.  Neck: Normal range of motion.  Cardiovascular: Normal rate, regular rhythm and normal heart sounds.  Exam reveals no gallop and no friction rub.   No murmur heard. Pulmonary/Chest: Effort normal and breath sounds normal. No respiratory distress.  Abdominal: Soft. Bowel sounds are normal. She exhibits no distension and no mass. There is no tenderness. There is no guarding.  Musculoskeletal: Normal range of motion.  Neurological: She is alert and oriented to  person, place, and time.       Speech is clear and goal oriented, follows commands Major Cranial nerves without deficit, no facial droop Normal strength in upper and lower extremities bilaterally including dorsiflexion and plantar flexion, strong and equal grip strength Sensation normal to light and sharp touch Moves extremities without ataxia, coordination intact Normal finger to nose and rapid alternating movements Neg romberg, no pronator drift Normal gait Normal heel-shin and balance   Skin: Skin is warm and dry. She is not diaphoretic.    ED Course  Procedures (including critical care time)   Labs Reviewed  CBC WITH DIFFERENTIAL  COMPREHENSIVE METABOLIC PANEL  URINALYSIS, ROUTINE W REFLEX MICROSCOPIC  PROTIME-INR  APTT  LIPID PANEL  HEMOGLOBIN A1C     Date: 03/05/2012  Rate: 51  Rhythm: normal sinus rhythm  QRS Axis: normal  Intervals: normal  ST/T Wave abnormalities: normal  Conduction Disutrbances: none  Narrative Interpretation:   Old EKG Reviewed: No significant changes noted    No results found.   No diagnosis found.    MDM  12:45 PM BP 122/55  Pulse 50  Temp 98 F (36.7 C)  Resp 18  SpO2 95% Patient with symptoms concerning for TIAs in posterior cerebral vascular circulation.  She is high-risk for this as she does have a history of cerebrovascular disease.  She is asymptomatic currently.  Labs are pending.  I am placing her in the CDU on TIA protocol. Pt is seen and examined; Initial history and physical IV fluids, pain meds & antiemetics given. Labs and urine tests have been ordered and pending. Disposition will be pending lab studies and reassessment. Will be signed to PA Narvaez in CDU.       1:17 PM Patient with Hypokalemia to 2.8 repelete with PO potassium  Arthor Captain, PA-C 03/05/12 1611

## 2012-03-05 NOTE — ED Notes (Signed)
Pt ambulatory leaving ED with husband; pt given d/c teaching and follw-up care instructions; pt verbalized understanding and has no further questions upon d/c. Pt does not appear to be in acute distress upon d/c.

## 2012-03-05 NOTE — ED Notes (Signed)
Doppler at bedside.

## 2012-03-05 NOTE — ED Notes (Signed)
Report taken from Melissa, RN

## 2012-03-05 NOTE — ED Notes (Signed)
Pt denies nausea; pt denies pain; pt denies numbness and weakness; pt mentating appropriately.

## 2012-03-05 NOTE — ED Provider Notes (Deleted)
Medical screening examination/treatment/procedure(s) were conducted as a shared visit with non-physician practitioner(s) and myself.  I personally evaluated the patient during the encounter  Pt with multilpe episodes of dizziness and unilateral numbness over the past 1-2 weeks.  Had neg head CT in [place] 8 days ago by history.  Pt with h/o endarterectomy 8 years ago discovered on routine screening test.  Pt has had vertigo in the past but these episodes of different.  Will obtain brain MRI and MRA of neck to assess for TIA or posterior circulation  CVA or TIA.     Gavin Pound. Maythe Deramo, MD 03/05/12 1313

## 2012-03-05 NOTE — Progress Notes (Signed)
VASCULAR LAB PRELIMINARY  PRELIMINARY  PRELIMINARY  PRELIMINARY  Carotid duplex  completed.    Preliminary report:  Bilateral:  No evidence of hemodynamically significant internal carotid artery stenosis.   Vertebral artery flow is antegrade.      Makisha Marrin, RVT 03/05/2012, 4:48 PM

## 2012-03-18 DIAGNOSIS — R209 Unspecified disturbances of skin sensation: Secondary | ICD-10-CM | POA: Insufficient documentation

## 2012-03-18 DIAGNOSIS — R42 Dizziness and giddiness: Secondary | ICD-10-CM | POA: Insufficient documentation

## 2012-06-01 ENCOUNTER — Telehealth (INDEPENDENT_AMBULATORY_CARE_PROVIDER_SITE_OTHER): Payer: Self-pay | Admitting: *Deleted

## 2012-06-01 NOTE — Telephone Encounter (Signed)
Per Dewayne Hatch , the patient was to have had repeat exam in 5 years. Forwarded to Dewayne Hatch to review

## 2012-06-01 NOTE — Telephone Encounter (Signed)
Jill Fisher's last TCS was 01/14/05 and would like to know when her next TCS is due?

## 2012-06-02 NOTE — Telephone Encounter (Signed)
Patient last TCS was 2006 per Dr Patty Sermons op note patient was to repeat in 5 years, I spoken to Jill Fisher she will give me a call back tomorrow (3/27) to scheduled TCS

## 2012-06-03 ENCOUNTER — Telehealth (INDEPENDENT_AMBULATORY_CARE_PROVIDER_SITE_OTHER): Payer: Self-pay | Admitting: *Deleted

## 2012-06-03 ENCOUNTER — Other Ambulatory Visit (INDEPENDENT_AMBULATORY_CARE_PROVIDER_SITE_OTHER): Payer: Self-pay | Admitting: *Deleted

## 2012-06-03 DIAGNOSIS — Z86010 Personal history of colonic polyps: Secondary | ICD-10-CM

## 2012-06-03 DIAGNOSIS — Z1211 Encounter for screening for malignant neoplasm of colon: Secondary | ICD-10-CM

## 2012-06-03 MED ORDER — PEG-KCL-NACL-NASULF-NA ASC-C 100 G PO SOLR: 1.0000 | Freq: Once | ORAL | Status: DC

## 2012-06-03 NOTE — Telephone Encounter (Signed)
TCS sch'd 06/29/12 at 1200 (1100) patient aware

## 2012-06-03 NOTE — Telephone Encounter (Signed)
agree

## 2012-06-03 NOTE — Telephone Encounter (Signed)
  Procedure: tcs  Reason/Indication:  Hx polyps  Has patient had this procedure before?  Yes, 2006 (EPIC)  If so, when, by whom and where?    Is there a family history of colon cancer?  no  Who?  What age when diagnosed?    Is patient diabetic?   no      Does patient have prosthetic heart valve?  no  Do you have a pacemaker?  no  Has patient ever had endocarditis? no  Has patient had joint replacement within last 12 months?  no  Is patient on Coumadin, Plavix and/or Aspirin? yes  Medications: asa 81 mg daily, fish oil, atenolol 50/25 mg daily, travastatin 40 mg daily, klor-con 20 mg daily, preservision eye vitamin daily  Allergies: codiene  Medication Adjustment: asa 2 days  Procedure date & time: 06/29/12 at 1200

## 2012-06-03 NOTE — Telephone Encounter (Signed)
Patient needs movi prep 

## 2012-06-25 ENCOUNTER — Encounter (HOSPITAL_COMMUNITY): Payer: Self-pay | Admitting: Pharmacy Technician

## 2012-06-29 ENCOUNTER — Ambulatory Visit (HOSPITAL_COMMUNITY)
Admission: RE | Admit: 2012-06-29 | Discharge: 2012-06-29 | Disposition: A | Discharge status: Home / Self Care | Payer: Medicare Other | Source: Ambulatory Visit | Attending: Internal Medicine | Admitting: Internal Medicine

## 2012-06-29 ENCOUNTER — Encounter (HOSPITAL_COMMUNITY)
Admission: RE | Disposition: A | Discharge status: Home / Self Care | Payer: Self-pay | Source: Ambulatory Visit | Attending: Internal Medicine

## 2012-06-29 DIAGNOSIS — K5730 Diverticulosis of large intestine without perforation or abscess without bleeding: Secondary | ICD-10-CM | POA: Insufficient documentation

## 2012-06-29 DIAGNOSIS — Z7982 Long term (current) use of aspirin: Secondary | ICD-10-CM | POA: Insufficient documentation

## 2012-06-29 DIAGNOSIS — I679 Cerebrovascular disease, unspecified: Secondary | ICD-10-CM | POA: Insufficient documentation

## 2012-06-29 DIAGNOSIS — Z800 Family history of malignant neoplasm of digestive organs: Secondary | ICD-10-CM | POA: Insufficient documentation

## 2012-06-29 DIAGNOSIS — Z860100 Personal history of colon polyps, unspecified: Secondary | ICD-10-CM | POA: Insufficient documentation

## 2012-06-29 DIAGNOSIS — Z1211 Encounter for screening for malignant neoplasm of colon: Secondary | ICD-10-CM | POA: Insufficient documentation

## 2012-06-29 DIAGNOSIS — D126 Benign neoplasm of colon, unspecified: Secondary | ICD-10-CM

## 2012-06-29 DIAGNOSIS — Z79899 Other long term (current) drug therapy: Secondary | ICD-10-CM | POA: Insufficient documentation

## 2012-06-29 DIAGNOSIS — K6389 Other specified diseases of intestine: Secondary | ICD-10-CM

## 2012-06-29 DIAGNOSIS — I2510 Atherosclerotic heart disease of native coronary artery without angina pectoris: Secondary | ICD-10-CM | POA: Insufficient documentation

## 2012-06-29 DIAGNOSIS — K219 Gastro-esophageal reflux disease without esophagitis: Secondary | ICD-10-CM | POA: Insufficient documentation

## 2012-06-29 DIAGNOSIS — E785 Hyperlipidemia, unspecified: Secondary | ICD-10-CM | POA: Insufficient documentation

## 2012-06-29 DIAGNOSIS — Z885 Allergy status to narcotic agent status: Secondary | ICD-10-CM | POA: Insufficient documentation

## 2012-06-29 HISTORY — PX: COLONOSCOPY: SHX5424

## 2012-06-29 SURGERY — COLONOSCOPY
Anesthesia: Moderate Sedation

## 2012-06-29 MED ORDER — MEPERIDINE HCL 50 MG/ML IJ SOLN
INTRAMUSCULAR | Status: AC
  Filled 2012-06-29: qty 1

## 2012-06-29 MED ORDER — STERILE WATER FOR IRRIGATION IR SOLN
Status: DC | PRN
  Administered 2012-06-29: 13:00:00

## 2012-06-29 MED ORDER — MEPERIDINE HCL 50 MG/ML IJ SOLN
INTRAMUSCULAR | Status: DC | PRN
  Administered 2012-06-29 (×2): 25 mg via INTRAVENOUS

## 2012-06-29 MED ORDER — MIDAZOLAM HCL 5 MG/5ML IJ SOLN
INTRAMUSCULAR | Status: DC | PRN
  Administered 2012-06-29: 1 mg via INTRAVENOUS
  Administered 2012-06-29: 2 mg via INTRAVENOUS
  Administered 2012-06-29: 1 mg via INTRAVENOUS

## 2012-06-29 MED ORDER — SODIUM CHLORIDE 0.9 % IV SOLN
INTRAVENOUS | Status: DC
  Administered 2012-06-29: 1000 mL via INTRAVENOUS

## 2012-06-29 MED ORDER — MIDAZOLAM HCL 5 MG/5ML IJ SOLN
INTRAMUSCULAR | Status: AC
  Filled 2012-06-29: qty 10

## 2012-06-29 NOTE — Op Note (Signed)
COLONOSCOPY PROCEDURE REPORT  PATIENT:  Jill Fisher  MR#:  [id number] Birthdate:  May 31, 1934, 78 y.o., female Endoscopist:  Dr. Malissa Hippo, MD Referred By:  Dr. Annette Stable A. Olena Leatherwood, MD Procedure Date: 06/29/2012  Procedure:   Colonoscopy  Indications:  Patient is 78 year old Caucasian female with history of colonic adenoma and family history of colon carcinoma in first degree relative.  Informed Consent:  The procedure and risks were reviewed with the patient and informed consent was obtained.  Medications:  Demerol 50 mg IV Versed 4 mg IV  Description of procedure:  After a digital rectal exam was performed, that colonoscope was advanced from the anus through the rectum and colon to the area of the cecum, ileocecal valve and appendiceal orifice. The cecum was deeply intubated. These structures were well-seen and photographed for the record. From the level of the cecum and ileocecal valve, the scope was slowly and cautiously withdrawn. The mucosal surfaces were carefully surveyed utilizing scope tip to flexion to facilitate fold flattening as needed. The scope was pulled down into the rectum where a thorough exam including retroflexion was performed.  Findings:   Prep excellent. Multiple diverticula noted at sigmoid colon along with more diverticula scattered through rest of the colon. five small polyps ablated via cold biopsy from ascending colon and submitted together. Normal rectal mucosa. Two small anal papillae.    Therapeutic/Diagnostic Maneuvers Performed:  See above  Complications:  None  Cecal Withdrawal Time:  20 minutes  Impression:  Examination performed to cecum. Pancolonic diverticulosis. Five small polyps ablated via cold biopsy from ascending colon and submitted together.  Recommendations:  Standard instructions given. I will contact patient with biopsy results and further recommendations.  Kahil Agner U  06/29/2012 1:43 PM  CC: Dr. Toma Deiters, MD &  Dr. Bonnetta Barry ref. provider found

## 2012-06-29 NOTE — H&P (Signed)
Jill Fisher is an 78 y.o. female.   Chief Complaint: Patient is here for colonoscopy. HPI: Patient is 78 year old Caucasian female who has history of colonic adenoma and family history of colon carcinoma in her brother(? 60,s) who is here for surveillance colonoscopy. Her last exam was in 2006. She denies abdominal pain change in her bowel habits or rectal bleeding.  Past Medical History  Diagnosis Date  . Coronary artery disease     LVEF  65%  . Hyperlipidemia   . GERD (gastroesophageal reflux disease)   . Cerebrovascular disease     followed at [facility]    Past Surgical History  Procedure Laterality Date  . Cholecystectomy    . Carotid endarterectomy      left    No family history on file. Social History:  reports that she has never smoked. She does not have any smokeless tobacco history on file. She reports that she does not drink alcohol. Her drug history is not on file.  Allergies:  Allergies  Allergen Reactions  . Codeine     Makes me jittery    Medications Prior to Admission  Medication Sig Dispense Refill  . aspirin (ASPIRIN EC) 81 MG EC tablet Take 81 mg by mouth daily. Swallow whole.      Marland Kitchen atenolol-chlorthalidone (TENORETIC) 50-25 MG per tablet Take 1 tablet by mouth daily.      . Multiple Vitamins-Minerals (PRESERVISION AREDS 2) CAPS Take 1 capsule by mouth daily.      . Omega-3 Fatty Acids (FISH OIL) 1000 MG CAPS Take 1,000 mg by mouth 2 (two) times daily.       . peg 3350 powder (MOVIPREP) 100 G SOLR Take 1 kit (100 g total) by mouth once.  1 kit  0  . potassium chloride (K-DUR,KLOR-CON) 10 MEQ tablet Take 10 mEq by mouth daily.       . pravastatin (PRAVACHOL) 40 MG tablet Take 40 mg by mouth daily.      . propranolol (INDERAL) 20 MG tablet Take 20 mg by mouth as directed. Taking 3 to 4 times a day as needed for heart palpitations        No results found for this or any previous visit (from the past 48 hour(s)). No results found.  ROS  Blood pressure  146/73, pulse 81, temperature 97.9 F (36.6 C), temperature source Oral, resp. rate 18, height 5\' 6"  (1.676 m), weight 160 lb (72.576 kg), SpO2 94.00%. Physical Exam  Constitutional: She appears well-developed and well-nourished.  HENT:  Mouth/Throat: Oropharynx is clear and moist.  Eyes: Conjunctivae are normal.  Neck: No thyromegaly present.  Cardiovascular: Normal rate, regular rhythm and normal heart sounds.   No murmur heard. Respiratory: Effort normal and breath sounds normal.  GI: Soft. She exhibits no mass. There is no tenderness.  Musculoskeletal: She exhibits no edema.  Lymphadenopathy:    She has no cervical adenopathy.  Neurological: She is alert.  Skin: Skin is warm and dry.     Assessment/Plan History of colonic adenoma and family history of colon carcinoma in a first-degree relative. Surveillance colonoscopy.  Jill Fisher U 06/29/2012, 12:53 PM

## 2012-06-30 ENCOUNTER — Encounter (HOSPITAL_COMMUNITY): Payer: Self-pay | Admitting: Internal Medicine

## 2012-07-09 ENCOUNTER — Encounter: Payer: Self-pay | Admitting: Neurology

## 2012-07-09 ENCOUNTER — Ambulatory Visit (INDEPENDENT_AMBULATORY_CARE_PROVIDER_SITE_OTHER): Payer: Medicare Other | Admitting: Neurology

## 2012-07-09 VITALS — BP 112/61 | HR 65 | Ht 65.5 in | Wt 169.0 lb

## 2012-07-09 DIAGNOSIS — I679 Cerebrovascular disease, unspecified: Secondary | ICD-10-CM

## 2012-07-09 DIAGNOSIS — R42 Dizziness and giddiness: Secondary | ICD-10-CM

## 2012-07-09 NOTE — Progress Notes (Signed)
Reason for visit: Dizziness  Jill Fisher is an 78 y.o. female  History of present illness:  Jill Fisher is a 78 year old right-handed white female with a history of episodes of dizziness, flushing, increased heart rate, and shortness of breath. The patient was having a lot of stress in January 2014, but she indicates that she is doing better at this point. The patient had a workup that has included a MRI of the brain, and an EEG study and some blood work. These evaluations were unremarkable. The patient has alprazolam at home to take if needed. The patient does have a chronic history of inner ear disease, and she indicates that she gets vertigo if she looks up for a long period. The patient has meclizine to take for these episodes. The patient overall has done quite well, and she feels back to normal. The patient returns for an evaluation.  Past Medical History  Diagnosis Date  . Coronary artery disease     LVEF  65%  . Hyperlipidemia   . GERD (gastroesophageal reflux disease)   . Cerebrovascular disease     followed at [facility]  . Hypertension     Past Surgical History  Procedure Laterality Date  . Cholecystectomy    . Carotid endarterectomy      left  . Colonoscopy N/A 06/29/2012    Procedure: COLONOSCOPY;  Surgeon: Malissa Hippo, MD;  Location: [facility];  Service: Endoscopy;  Laterality: N/A;  1200-moved to 1215 [name] to notify pt  . Carotid stent insertion Left     Family History  Problem Relation Age of Onset  . Heart disease Mother   . Cerebrovascular Disease Father   . Cancer Sister   . Cerebrovascular Disease Brother   . Diabetes Brother   . Heart attack Brother   . Liver disease Brother     Social history:  reports that she has never smoked. She does not have any smokeless tobacco history on file. She reports that she does not drink alcohol. Her drug history is not on file.  Allergies:  Allergies  Allergen Reactions  . Codeine     Makes me jittery     Medications:  Current Outpatient Prescriptions on File Prior to Visit  Medication Sig Dispense Refill  . aspirin (ASPIRIN EC) 81 MG EC tablet Take 81 mg by mouth daily. Swallow whole.      Marland Kitchen atenolol-chlorthalidone (TENORETIC) 50-25 MG per tablet Take 1 tablet by mouth daily.      . Multiple Vitamins-Minerals (PRESERVISION AREDS 2) CAPS Take 1 capsule by mouth daily.      . Omega-3 Fatty Acids (FISH OIL) 1000 MG CAPS Take 1,000 mg by mouth 2 (two) times daily.       . potassium chloride (K-DUR,KLOR-CON) 10 MEQ tablet Take 10 mEq by mouth daily.       . pravastatin (PRAVACHOL) 40 MG tablet Take 40 mg by mouth daily.      . propranolol (INDERAL) 20 MG tablet Take 20 mg by mouth as directed. Taking 3 to 4 times a day as needed for heart palpitations       No current facility-administered medications on file prior to visit.    ROS:  Out of a complete 14 system review of symptoms, the patient complains only of the following symptoms, and all other reviewed systems are negative.  Swelling in the legs Moles Easy bruising Increased thirst Nocturnal leg cramps, right  Blood pressure 112/61, pulse 65, height 5' 5.5" (  1.664 m), weight 169 lb (76.658 kg).  Physical Exam  General: The patient is alert and cooperative at the time of the examination.  Skin: No significant peripheral edema is noted.   Neurologic Exam  Cranial nerves: Facial symmetry is present. Speech is normal, no aphasia or dysarthria is noted. Extraocular movements are full. Visual fields are full.  Motor: The patient has good strength in all 4 extremities.  Coordination: The patient has good finger-nose-finger and heel-to-shin bilaterally.  Gait and station: The patient has a normal gait. Tandem gait is slightly unsteady. Romberg is negative. No drift is seen.  Reflexes: Deep tendon reflexes are symmetric.   Assessment/Plan:  1. Episodes of dizziness, flushing, increased heart rate, possible panic  attacks  2. Positional vertigo  The patient is doing quite well at this point. The patient will followup through this office if needed. The patient is currently asymptomatic. Workup done was unremarkable.  Jill Palau MD 07/09/2012 11:41 AM  [facility] [address] [place], [place] [address]  Phone [phone] Fax [phone]

## 2012-07-12 ENCOUNTER — Encounter (INDEPENDENT_AMBULATORY_CARE_PROVIDER_SITE_OTHER): Payer: Self-pay | Admitting: *Deleted

## 2012-11-18 ENCOUNTER — Encounter (HOSPITAL_COMMUNITY): Payer: Self-pay | Admitting: Pharmacy Technician

## 2012-11-23 ENCOUNTER — Encounter (HOSPITAL_COMMUNITY)
Admission: RE | Admit: 2012-11-23 | Discharge: 2012-11-23 | Disposition: A | Discharge status: Home / Self Care | Payer: Medicare Other | Source: Ambulatory Visit | Attending: Ophthalmology | Admitting: Ophthalmology

## 2012-11-23 ENCOUNTER — Encounter (HOSPITAL_COMMUNITY): Payer: Self-pay

## 2012-11-23 DIAGNOSIS — Z01812 Encounter for preprocedural laboratory examination: Secondary | ICD-10-CM | POA: Insufficient documentation

## 2012-11-23 LAB — BASIC METABOLIC PANEL
BUN: 14 mg/dL (ref 6–23)
CO2: 29 mEq/L (ref 19–32)
Calcium: 9.5 mg/dL (ref 8.4–10.5)
Chloride: 99 mEq/L (ref 96–112)
Creatinine, Ser: 0.73 mg/dL (ref 0.50–1.10)
GFR calc Af Amer: >90 mL/min (ref >90)
GFR calc non Af Amer: 81 mL/min — ABNORMAL LOW (ref >90)
Glucose, Bld: 136 mg/dL — ABNORMAL HIGH (ref 70–99)
Potassium: 3.5 mEq/L (ref 3.5–5.1)
Sodium: 137 mEq/L (ref 135–145)

## 2012-11-23 LAB — HEMOGLOBIN AND HEMATOCRIT, BLOOD
HCT: 39.9 % (ref 36.0–46.0)
Hemoglobin: 14 g/dL (ref 12.0–15.0)

## 2012-11-23 NOTE — Patient Instructions (Addendum)
Jill Fisher  11/23/2012   Your procedure is scheduled on:  11/30/12  Report to [facility] at 0930 AM.  Call this number if you have problems the morning of surgery: [phone]   Remember:   Do not eat food or drink liquids after midnight.   Take these medicines the morning of surgery with A SIP OF WATER: tenoretic, inderal   Do not wear jewelry, make-up or nail polish.  Do not wear lotions, powders, or perfumes. You may wear deodorant.  Do not shave 48 hours prior to surgery. Men may shave face and neck.  Do not bring valuables to the hospital.  [facility] is not responsible                   for any belongings or valuables.  Contacts, dentures or bridgework may not be worn into surgery.  Leave suitcase in the car. After surgery it may be brought to your room.  For patients admitted to the hospital, checkout time is 11:00 AM the day of  discharge.   Patients discharged the day of surgery will not be allowed to drive  home.  Name and phone number of your driver: family  Special Instructions: N/A   Please read over the following fact sheets that you were given: Anesthesia Post-op Instructions and Care and Recovery After Surgery   PATIENT INSTRUCTIONS POST-ANESTHESIA  IMMEDIATELY FOLLOWING SURGERY:  Do not drive or operate machinery for the first twenty four hours after surgery.  Do not make any important decisions for twenty four hours after surgery or while taking narcotic pain medications or sedatives.  If you develop intractable nausea and vomiting or a severe headache please notify your doctor immediately.  FOLLOW-UP:  Please make an appointment with your surgeon as instructed. You do not need to follow up with anesthesia unless specifically instructed to do so.  WOUND CARE INSTRUCTIONS (if applicable):  Keep a dry clean dressing on the anesthesia/puncture wound site if there is drainage.  Once the wound has quit draining you may leave it open to air.  Generally you should leave  the bandage intact for twenty four hours unless there is drainage.  If the epidural site drains for more than 36-48 hours please call the anesthesia department.  QUESTIONS?:  Please feel free to call your physician or the hospital operator if you have any questions, and they will be happy to assist you.      Cataract Surgery  A cataract is a clouding of the lens of the eye. When a lens becomes cloudy, vision is reduced based on the degree and nature of the clouding. Surgery may be needed to improve vision. Surgery removes the cloudy lens and usually replaces it with a substitute lens (intraocular lens, IOL). LET YOUR EYE DOCTOR KNOW ABOUT:  Allergies to food or medicine.  Medicines taken including herbs, eyedrops, over-the-counter medicines, and creams.  Use of steroids (by mouth or creams).  Previous problems with anesthetics or numbing medicine.  History of bleeding problems or blood clots.  Previous surgery.  Other health problems, including diabetes and kidney problems.  Possibility of pregnancy, if this applies. RISKS AND COMPLICATIONS  Infection.  Inflammation of the eyeball (endophthalmitis) that can spread to both eyes (sympathetic ophthalmia).  Poor wound healing.  If an IOL is inserted, it can later fall out of proper position. This is very uncommon.  Clouding of the part of your eye that holds an IOL in place. This is called an "  after-cataract." These are uncommon, but easily treated. BEFORE THE PROCEDURE  Do not eat or drink anything except small amounts of water for 8 to 12 before your surgery, or as directed by your caregiver.  Unless you are told otherwise, continue any eyedrops you have been prescribed.  Talk to your primary caregiver about all other medicines that you take (both prescription and non-prescription). In some cases, you may need to stop or change medicines near the time of your surgery. This is most important if you are taking blood-thinning  medicine.Do not stop medicines unless you are told to do so.  Arrange for someone to drive you to and from the procedure.  Do not put contact lenses in either eye on the day of your surgery. PROCEDURE There is more than one method for safely removing a cataract. Your doctor can explain the differences and help determine which is best for you. Phacoemulsification surgery is the most common form of cataract surgery.  An injection is given behind the eye or eyedrops are given to make this a painless procedure.  A small cut (incision) is made on the edge of the clear, dome-shaped surface that covers the front of the eye (cornea).  A tiny probe is painlessly inserted into the eye. This device gives off ultrasound waves that soften and break up the cloudy center of the lens. This makes it easier for the cloudy lens to be removed by suction.  An IOL may be implanted.  The normal lens of the eye is covered by a clear capsule. Part of that capsule is intentionally left in the eye to support the IOL.  Your surgeon may or may not use stitches to close the incision. There are other forms of cataract surgery that require a larger incision and stiches to close the eye. This approach is taken in cases where the doctor feels that the cataract cannot be easily removed using phacoemulsification. AFTER THE PROCEDURE  When an IOL is implanted, it does not need care. It becomes a permanent part of your eye and cannot be seen or felt.  Your doctor will schedule follow-up exams to check on your progress.  Review your other medicines with your doctor to see which can be resumed after surgery.  Use eyedrops or take medicine as prescribed by your doctor. Document Released: 02/13/2011 Document Revised: 05/19/2011 Document Reviewed: 02/13/2011 [facility] Patient Information 2014 [place], [place].

## 2012-11-29 MED ORDER — TETRACAINE HCL 0.5 % OP SOLN
OPHTHALMIC | Status: AC
  Filled 2012-11-29: qty 2

## 2012-11-29 MED ORDER — CYCLOPENTOLATE-PHENYLEPHRINE OP SOLN OPTIME - NO CHARGE
OPHTHALMIC | Status: AC
  Filled 2012-11-29: qty 2

## 2012-11-30 ENCOUNTER — Ambulatory Visit (HOSPITAL_COMMUNITY)
Admission: RE | Admit: 2012-11-30 | Discharge: 2012-11-30 | Disposition: A | Discharge status: Home / Self Care | Payer: Medicare Other | Source: Ambulatory Visit | Attending: Ophthalmology | Admitting: Ophthalmology

## 2012-11-30 ENCOUNTER — Encounter (HOSPITAL_COMMUNITY): Payer: Self-pay | Admitting: *Deleted

## 2012-11-30 ENCOUNTER — Ambulatory Visit (HOSPITAL_COMMUNITY): Payer: Medicare Other | Admitting: Anesthesiology

## 2012-11-30 ENCOUNTER — Encounter (HOSPITAL_COMMUNITY): Payer: Self-pay | Admitting: Anesthesiology

## 2012-11-30 ENCOUNTER — Encounter (HOSPITAL_COMMUNITY)
Admission: RE | Disposition: A | Discharge status: Home / Self Care | Payer: Self-pay | Source: Ambulatory Visit | Attending: Ophthalmology

## 2012-11-30 DIAGNOSIS — I10 Essential (primary) hypertension: Secondary | ICD-10-CM | POA: Insufficient documentation

## 2012-11-30 DIAGNOSIS — H2510 Age-related nuclear cataract, unspecified eye: Secondary | ICD-10-CM | POA: Insufficient documentation

## 2012-11-30 HISTORY — PX: CATARACT EXTRACTION W/PHACO: SHX586

## 2012-11-30 SURGERY — PHACOEMULSIFICATION, CATARACT, WITH IOL INSERTION
Anesthesia: Monitor Anesthesia Care | Site: Eye | Laterality: Right | Wound class: Clean

## 2012-11-30 MED ORDER — LIDOCAINE HCL (PF) 1 % IJ SOLN
INTRAMUSCULAR | Status: AC
  Filled 2012-11-30: qty 2

## 2012-11-30 MED ORDER — TETRACAINE 0.5 % OP SOLN OPTIME - NO CHARGE
OPHTHALMIC | Status: DC | PRN
  Administered 2012-11-30: 1 [drp] via OPHTHALMIC

## 2012-11-30 MED ORDER — BSS IO SOLN
INTRAOCULAR | Status: DC | PRN
  Administered 2012-11-30: 15 mL via INTRAOCULAR

## 2012-11-30 MED ORDER — MIDAZOLAM HCL 2 MG/2ML IJ SOLN
INTRAMUSCULAR | Status: AC
  Filled 2012-11-30: qty 2

## 2012-11-30 MED ORDER — PHENYLEPHRINE HCL 2.5 % OP SOLN
1.0000 [drp] | OPHTHALMIC | Status: AC
  Administered 2012-11-30 (×3): 1 [drp] via OPHTHALMIC

## 2012-11-30 MED ORDER — PROVISC 10 MG/ML IO SOLN
INTRAOCULAR | Status: DC | PRN
  Administered 2012-11-30: 8.5 mg via INTRAOCULAR

## 2012-11-30 MED ORDER — CYCLOPENTOLATE-PHENYLEPHRINE 0.2-1 % OP SOLN
1.0000 [drp] | OPHTHALMIC | Status: AC
  Administered 2012-11-30 (×3): 1 [drp] via OPHTHALMIC

## 2012-11-30 MED ORDER — KETOROLAC TROMETHAMINE 0.5 % OP SOLN
1.0000 [drp] | OPHTHALMIC | Status: AC
  Administered 2012-11-30 (×3): 1 [drp] via OPHTHALMIC

## 2012-11-30 MED ORDER — LACTATED RINGERS IV SOLN
INTRAVENOUS | Status: DC
  Administered 2012-11-30: 08:00:00 via INTRAVENOUS

## 2012-11-30 MED ORDER — EPINEPHRINE HCL 1 MG/ML IJ SOLN
INTRAMUSCULAR | Status: AC
  Filled 2012-11-30: qty 1

## 2012-11-30 MED ORDER — TETRACAINE HCL 0.5 % OP SOLN
1.0000 [drp] | OPHTHALMIC | Status: AC
  Administered 2012-11-30 (×3): 1 [drp] via OPHTHALMIC

## 2012-11-30 MED ORDER — EPINEPHRINE HCL 1 MG/ML IJ SOLN
INTRAOCULAR | Status: DC | PRN
  Administered 2012-11-30: 08:00:00

## 2012-11-30 MED ORDER — MIDAZOLAM HCL 2 MG/2ML IJ SOLN
1.0000 mg | INTRAMUSCULAR | Status: DC | PRN
  Administered 2012-11-30: 2 mg via INTRAVENOUS

## 2012-11-30 SURGICAL SUPPLY — 23 items
CAPSULAR TENSION RING-AMO (OPHTHALMIC RELATED) IMPLANT
CLOTH BEACON ORANGE TIMEOUT ST (SAFETY) ×1 IMPLANT
EYE SHIELD UNIVERSAL CLEAR (GAUZE/BANDAGES/DRESSINGS) ×1 IMPLANT
GLOVE BIO SURGEON STRL SZ 6.5 (GLOVE) IMPLANT
GLOVE ECLIPSE 6.5 STRL STRAW (GLOVE) IMPLANT
GLOVE ECLIPSE 7.0 STRL STRAW (GLOVE) IMPLANT
GLOVE EXAM NITRILE LRG STRL (GLOVE) ×1 IMPLANT
GLOVE EXAM NITRILE MD LF STRL (GLOVE) IMPLANT
GLOVE SKINSENSE NS SZ6.5 (GLOVE)
GLOVE SKINSENSE STRL SZ6.5 (GLOVE) IMPLANT
HEALON 5 0.6 ML (INTRAOCULAR LENS) IMPLANT
KIT VITRECTOMY (OPHTHALMIC RELATED) IMPLANT
PAD ARMBOARD 7.5X6 YLW CONV (MISCELLANEOUS) ×1 IMPLANT
PROC W NO LENS (INTRAOCULAR LENS)
PROC W SPEC LENS (INTRAOCULAR LENS)
PROCESS W NO LENS (INTRAOCULAR LENS) IMPLANT
PROCESS W SPEC LENS (INTRAOCULAR LENS) IMPLANT
RING MALYGIN (MISCELLANEOUS) IMPLANT
SIGHTPATH CAT PROC W REG LENS (Ophthalmic Related) ×2 IMPLANT
TAPE SURG TRANSPORE 1 IN (GAUZE/BANDAGES/DRESSINGS) IMPLANT
TAPE SURGICAL TRANSPORE 1 IN (GAUZE/BANDAGES/DRESSINGS) ×1
VISCOELASTIC ADDITIONAL (OPHTHALMIC RELATED) IMPLANT
WATER STERILE IRR 250ML POUR (IV SOLUTION) ×1 IMPLANT

## 2012-11-30 NOTE — Anesthesia Postprocedure Evaluation (Signed)
  Anesthesia Post-op Note  Patient: Jill Fisher  Procedure(s) Performed: Procedure(s) (LRB): CATARACT EXTRACTION PHACO AND INTRAOCULAR LENS PLACEMENT (IOC) (Right)  Patient Location:  Short Stay  Anesthesia Type: MAC  Level of Consciousness: awake  Airway and Oxygen Therapy: Patient Spontanous Breathing  Post-op Pain: none  Post-op Assessment: Post-op Vital signs reviewed, Patient's Cardiovascular Status Stable, Respiratory Function Stable, Patent Airway, No signs of Nausea or vomiting and Pain level controlled  Post-op Vital Signs: Reviewed and stable  Complications: No apparent anesthesia complications

## 2012-11-30 NOTE — Anesthesia Preprocedure Evaluation (Signed)
Anesthesia Evaluation  Patient identified by MRN, date of birth, ID band Patient awake    Reviewed: Allergy & Precautions, H&P , NPO status , Patient's Chart, lab work & pertinent test results  Airway Mallampati: II      Dental  (+) Teeth Intact and Partial Upper   Pulmonary  breath sounds clear to auscultation        Cardiovascular hypertension, + CAD and + Peripheral Vascular Disease + dysrhythmias Rhythm:Regular Rate:Normal     Neuro/Psych Carotid endarterectomy    GI/Hepatic GERD-  ,  Endo/Other    Renal/GU      Musculoskeletal   Abdominal   Peds  Hematology   Anesthesia Other Findings   Reproductive/Obstetrics                           Anesthesia Physical Anesthesia Plan  ASA: III  Anesthesia Plan: MAC   Post-op Pain Management:    Induction: Intravenous  Airway Management Planned: Nasal Cannula  Additional Equipment:   Intra-op Plan:   Post-operative Plan:   Informed Consent: I have reviewed the patients History and Physical, chart, labs and discussed the procedure including the risks, benefits and alternatives for the proposed anesthesia with the patient or authorized representative who has indicated his/her understanding and acceptance.     Plan Discussed with:   Anesthesia Plan Comments:         Anesthesia Quick Evaluation

## 2012-11-30 NOTE — Anesthesia Procedure Notes (Signed)
Procedure Name: MAC Date/Time: 11/30/2012 8:00 AM Performed by: Franco Nones Pre-anesthesia Checklist: Patient identified, Emergency Drugs available, Suction available, Timeout performed and Patient being monitored Patient Re-evaluated:Patient Re-evaluated prior to inductionOxygen Delivery Method: Nasal Cannula

## 2012-11-30 NOTE — Transfer of Care (Signed)
Immediate Anesthesia Transfer of Care Note  Patient: Jill Fisher  Procedure(s) Performed: Procedure(s) (LRB): CATARACT EXTRACTION PHACO AND INTRAOCULAR LENS PLACEMENT (IOC) (Right)  Patient Location: Shortstay  Anesthesia Type: MAC  Level of Consciousness: awake  Airway & Oxygen Therapy: Patient Spontanous Breathing   Post-op Assessment: Report given to PACU RN, Post -op Vital signs reviewed and stable and Patient moving all extremities  Post vital signs: Reviewed and stable  Complications: No apparent anesthesia complications

## 2012-11-30 NOTE — Op Note (Signed)
Patient brought to the operating room and prepped and draped in the usual manner.  Lid speculum inserted in right eye.  Stab incision made at the twelve o'clock position.  Provisc instilled in the anterior chamber.   A 2.4 mm. Stab incision was made temporally.  An anterior capsulotomy was done with a bent 25 gauge needle.  The nucleus was hydrodissected.  The Phaco tip was inserted in the anterior chamber and the nucleus was emulsified.  CDE was 12.71.  The cortical material was then removed with the I and A tip.  Posterior capsule was the polished.  The anterior chamber was deepened with Provisc.  A 23.0 Diopter Rayner 570C IOL was then inserted in the capsular bag.  Provisc was then removed with the I and A tip.  The wound was then hydrated.  Patient sent to the Recovery Room in good condition with follow up in my office.  Preoperative Diagnosis:  Nuclear Cataract OD Postoperative Diagnosis:  Same Procedure name: Kelman Phacoemulsification OD with IOL

## 2012-11-30 NOTE — H&P (Signed)
The patient was re examined and there is no change in the patients condition since the original H and P. 

## 2012-12-01 ENCOUNTER — Encounter (HOSPITAL_COMMUNITY): Payer: Self-pay | Admitting: Ophthalmology

## 2012-12-06 ENCOUNTER — Encounter (HOSPITAL_COMMUNITY): Payer: Self-pay | Admitting: Pharmacy Technician

## 2012-12-14 ENCOUNTER — Encounter (HOSPITAL_COMMUNITY)
Admission: RE | Admit: 2012-12-14 | Discharge: 2012-12-14 | Disposition: A | Discharge status: Home / Self Care | Payer: Medicare Other | Source: Ambulatory Visit | Attending: Ophthalmology | Admitting: Ophthalmology

## 2012-12-14 ENCOUNTER — Encounter (HOSPITAL_COMMUNITY): Payer: Self-pay

## 2012-12-14 MED ORDER — FENTANYL CITRATE 0.05 MG/ML IJ SOLN
25.0000 ug | INTRAMUSCULAR | Status: DC | PRN
  Administered 2012-12-21: 25 ug via INTRAVENOUS
  Filled 2012-12-14: qty 2

## 2012-12-14 MED ORDER — ONDANSETRON HCL 4 MG/2ML IJ SOLN: 4.0000 mg | Freq: Once | INTRAMUSCULAR | Status: AC | PRN

## 2012-12-20 MED ORDER — PHENYLEPHRINE HCL 2.5 % OP SOLN
OPHTHALMIC | Status: AC
  Filled 2012-12-20: qty 15

## 2012-12-20 MED ORDER — CYCLOPENTOLATE-PHENYLEPHRINE OP SOLN OPTIME - NO CHARGE
OPHTHALMIC | Status: AC
  Filled 2012-12-20: qty 2

## 2012-12-20 MED ORDER — KETOROLAC TROMETHAMINE 0.5 % OP SOLN
OPHTHALMIC | Status: AC
  Filled 2012-12-20: qty 5

## 2012-12-21 ENCOUNTER — Encounter (HOSPITAL_COMMUNITY): Payer: Medicare Other | Admitting: Anesthesiology

## 2012-12-21 ENCOUNTER — Ambulatory Visit (HOSPITAL_COMMUNITY)
Admission: RE | Admit: 2012-12-21 | Discharge: 2012-12-21 | Disposition: A | Discharge status: Home / Self Care | Payer: Medicare Other | Source: Ambulatory Visit | Attending: Ophthalmology | Admitting: Ophthalmology

## 2012-12-21 ENCOUNTER — Encounter (HOSPITAL_COMMUNITY): Payer: Self-pay | Admitting: *Deleted

## 2012-12-21 ENCOUNTER — Ambulatory Visit (HOSPITAL_COMMUNITY): Payer: Medicare Other | Admitting: Anesthesiology

## 2012-12-21 ENCOUNTER — Encounter (HOSPITAL_COMMUNITY)
Admission: RE | Disposition: A | Discharge status: Home / Self Care | Payer: Self-pay | Source: Ambulatory Visit | Attending: Ophthalmology

## 2012-12-21 DIAGNOSIS — Z01812 Encounter for preprocedural laboratory examination: Secondary | ICD-10-CM | POA: Insufficient documentation

## 2012-12-21 DIAGNOSIS — I10 Essential (primary) hypertension: Secondary | ICD-10-CM | POA: Insufficient documentation

## 2012-12-21 DIAGNOSIS — Z79899 Other long term (current) drug therapy: Secondary | ICD-10-CM | POA: Insufficient documentation

## 2012-12-21 DIAGNOSIS — H2510 Age-related nuclear cataract, unspecified eye: Secondary | ICD-10-CM | POA: Insufficient documentation

## 2012-12-21 HISTORY — PX: CATARACT EXTRACTION W/PHACO: SHX586

## 2012-12-21 LAB — GLUCOSE, CAPILLARY: Glucose-Capillary: 158 mg/dL — ABNORMAL HIGH (ref 70–99)

## 2012-12-21 SURGERY — PHACOEMULSIFICATION, CATARACT, WITH IOL INSERTION
Anesthesia: Monitor Anesthesia Care | Site: Eye | Laterality: Left | Wound class: Clean

## 2012-12-21 MED ORDER — MIDAZOLAM HCL 2 MG/2ML IJ SOLN
1.0000 mg | INTRAMUSCULAR | Status: DC | PRN
  Administered 2012-12-21: 2 mg via INTRAVENOUS

## 2012-12-21 MED ORDER — LACTATED RINGERS IV SOLN
INTRAVENOUS | Status: DC
  Administered 2012-12-21: 07:00:00 via INTRAVENOUS

## 2012-12-21 MED ORDER — PROVISC 10 MG/ML IO SOLN
INTRAOCULAR | Status: DC | PRN
  Administered 2012-12-21: 8.5 mg via INTRAOCULAR

## 2012-12-21 MED ORDER — FENTANYL CITRATE 0.05 MG/ML IJ SOLN: 25.0000 ug | INTRAMUSCULAR | Status: DC | PRN

## 2012-12-21 MED ORDER — BSS IO SOLN
INTRAOCULAR | Status: DC | PRN
  Administered 2012-12-21: 15 mL via INTRAOCULAR

## 2012-12-21 MED ORDER — EPINEPHRINE HCL 1 MG/ML IJ SOLN
INTRAOCULAR | Status: DC | PRN
  Administered 2012-12-21: 08:00:00

## 2012-12-21 MED ORDER — EPINEPHRINE HCL 1 MG/ML IJ SOLN
INTRAMUSCULAR | Status: AC
  Filled 2012-12-21: qty 1

## 2012-12-21 MED ORDER — LACTATED RINGERS IV SOLN: INTRAVENOUS | Status: DC

## 2012-12-21 MED ORDER — MIDAZOLAM HCL 2 MG/2ML IJ SOLN
INTRAMUSCULAR | Status: AC
  Filled 2012-12-21: qty 2

## 2012-12-21 MED ORDER — TETRACAINE 0.5 % OP SOLN OPTIME - NO CHARGE
OPHTHALMIC | Status: DC | PRN
  Administered 2012-12-21: 1 [drp] via OPHTHALMIC

## 2012-12-21 MED ORDER — TETRACAINE HCL 0.5 % OP SOLN
OPHTHALMIC | Status: AC
  Filled 2012-12-21: qty 2

## 2012-12-21 MED ORDER — CYCLOPENTOLATE-PHENYLEPHRINE 0.2-1 % OP SOLN
1.0000 [drp] | OPHTHALMIC | Status: AC
Start: 2012-12-21 — End: 2012-12-21
  Administered 2012-12-21 (×3): 1 [drp] via OPHTHALMIC

## 2012-12-21 MED ORDER — LIDOCAINE HCL (PF) 1 % IJ SOLN
INTRAMUSCULAR | Status: AC
  Filled 2012-12-21: qty 2

## 2012-12-21 MED ORDER — ONDANSETRON HCL 4 MG/2ML IJ SOLN: 4.0000 mg | Freq: Once | INTRAMUSCULAR | Status: DC | PRN

## 2012-12-21 MED ORDER — TETRACAINE HCL 0.5 % OP SOLN
1.0000 [drp] | OPHTHALMIC | Status: AC
  Administered 2012-12-21 (×3): 1 [drp] via OPHTHALMIC

## 2012-12-21 MED ORDER — FENTANYL CITRATE 0.05 MG/ML IJ SOLN: 25.0000 ug | Freq: Once | INTRAMUSCULAR | Status: DC

## 2012-12-21 MED ORDER — KETOROLAC TROMETHAMINE 0.5 % OP SOLN
1.0000 [drp] | OPHTHALMIC | Status: AC
  Administered 2012-12-21 (×3): 1 [drp] via OPHTHALMIC

## 2012-12-21 MED ORDER — PHENYLEPHRINE HCL 2.5 % OP SOLN
1.0000 [drp] | OPHTHALMIC | Status: AC
  Administered 2012-12-21 (×3): 1 [drp] via OPHTHALMIC

## 2012-12-21 SURGICAL SUPPLY — 25 items
CAPSULAR TENSION RING-AMO (OPHTHALMIC RELATED) IMPLANT
CLOTH BEACON ORANGE TIMEOUT ST (SAFETY) ×1 IMPLANT
EYE SHIELD UNIVERSAL CLEAR (GAUZE/BANDAGES/DRESSINGS) ×1 IMPLANT
GLOVE BIO SURGEON STRL SZ 6.5 (GLOVE) ×1 IMPLANT
GLOVE BIOGEL PI IND STRL 7.0 (GLOVE) IMPLANT
GLOVE BIOGEL PI INDICATOR 7.0 (GLOVE) ×1
GLOVE ECLIPSE 6.5 STRL STRAW (GLOVE) IMPLANT
GLOVE ECLIPSE 7.0 STRL STRAW (GLOVE) IMPLANT
GLOVE EXAM NITRILE LRG STRL (GLOVE) IMPLANT
GLOVE EXAM NITRILE MD LF STRL (GLOVE) IMPLANT
GLOVE SKINSENSE NS SZ6.5 (GLOVE)
GLOVE SKINSENSE STRL SZ6.5 (GLOVE) IMPLANT
HEALON 5 0.6 ML (INTRAOCULAR LENS) IMPLANT
KIT VITRECTOMY (OPHTHALMIC RELATED) IMPLANT
PAD ARMBOARD 7.5X6 YLW CONV (MISCELLANEOUS) ×1 IMPLANT
PROC W NO LENS (INTRAOCULAR LENS)
PROC W SPEC LENS (INTRAOCULAR LENS)
PROCESS W NO LENS (INTRAOCULAR LENS) IMPLANT
PROCESS W SPEC LENS (INTRAOCULAR LENS) IMPLANT
RING MALYGIN (MISCELLANEOUS) IMPLANT
SIGHTPATH CAT PROC W REG LENS (Ophthalmic Related) ×2 IMPLANT
TAPE SURG TRANSPORE 1 IN (GAUZE/BANDAGES/DRESSINGS) IMPLANT
TAPE SURGICAL TRANSPORE 1 IN (GAUZE/BANDAGES/DRESSINGS) ×1
VISCOELASTIC ADDITIONAL (OPHTHALMIC RELATED) IMPLANT
WATER STERILE IRR 250ML POUR (IV SOLUTION) ×1 IMPLANT

## 2012-12-21 NOTE — Op Note (Signed)
Patient brought to the operating room and prepped and draped in the usual manner.  Lid speculum inserted in left eye.  Stab incision made at the twelve o'clock position.  Provisc instilled in the anterior chamber.   A 2.4 mm. Stab incision was made temporally.  An anterior capsulotomy was done with a bent 25 gauge needle.  The nucleus was hydrodissected.  The Phaco tip was inserted in the anterior chamber and the nucleus was emulsified.  CDE was 12.65.  The cortical material was then removed with the I and A tip.  Posterior capsule was the polished.  The anterior chamber was deepened with Provisc.  A 23.0 Diopter Rayner 570C IOL was then inserted in the capsular bag.  Provisc was then removed with the I and A tip.  The wound was then hydrated.  Patient sent to the Recovery Room in good condition with follow up in my office.  Preoperative Diagnosis:  Nuclear Cataract OS Postoperative Diagnosis:  Same Procedure name: Kelman Phacoemulsification OS with IOL

## 2012-12-21 NOTE — Anesthesia Preprocedure Evaluation (Addendum)
Anesthesia Evaluation  Patient identified by MRN, date of birth, ID band Patient awake    Reviewed: Allergy & Precautions, H&P , NPO status , Patient's Chart, lab work & pertinent test results  Airway Mallampati: II      Dental  (+) Teeth Intact and Partial Upper   Pulmonary  breath sounds clear to auscultation        Cardiovascular hypertension, + CAD and + Peripheral Vascular Disease + dysrhythmias Rhythm:Regular Rate:Normal     Neuro/Psych Carotid endarterectomy    GI/Hepatic GERD-  ,  Endo/Other    Renal/GU      Musculoskeletal   Abdominal   Peds  Hematology   Anesthesia Other Findings   Reproductive/Obstetrics                           Anesthesia Physical Anesthesia Plan  ASA: III  Anesthesia Plan: MAC   Post-op Pain Management:    Induction: Intravenous  Airway Management Planned: Nasal Cannula  Additional Equipment:   Intra-op Plan:   Post-operative Plan:   Informed Consent: I have reviewed the patients History and Physical, chart, labs and discussed the procedure including the risks, benefits and alternatives for the proposed anesthesia with the patient or authorized representative who has indicated his/her understanding and acceptance.     Plan Discussed with:   Anesthesia Plan Comments:         Anesthesia Quick Evaluation

## 2012-12-21 NOTE — Anesthesia Postprocedure Evaluation (Signed)
  Anesthesia Post-op Note  Patient: Jill Fisher  Procedure(s) Performed: Procedure(s) with comments: CATARACT EXTRACTION PHACO AND INTRAOCULAR LENS PLACEMENT (IOC) (Left) - CDE:12.65  Patient Location: Short Stay  Anesthesia Type:MAC  Level of Consciousness: awake  Airway and Oxygen Therapy: Patient Spontanous Breathing  Post-op Pain: none  Post-op Assessment: Post-op Vital signs reviewed, Patient's Cardiovascular Status Stable, Respiratory Function Stable, Patent Airway and No signs of Nausea or vomiting  Post-op Vital Signs: Reviewed  Complications: No apparent anesthesia complications

## 2012-12-21 NOTE — H&P (Signed)
The patient was re examined and there is no change in the patients condition since the original H and P. 

## 2012-12-21 NOTE — Transfer of Care (Signed)
Immediate Anesthesia Transfer of Care Note  Patient: Jill Fisher  Procedure(s) Performed: Procedure(s) with comments: CATARACT EXTRACTION PHACO AND INTRAOCULAR LENS PLACEMENT (IOC) (Left) - CDE:12.65  Patient Location: PACU and Short Stay  Anesthesia Type:MAC  Level of Consciousness: awake, alert  and oriented  Airway & Oxygen Therapy: Patient Spontanous Breathing  Post-op Assessment: Report given to PACU RN  Post vital signs: Reviewed and stable  Complications: No apparent anesthesia complications

## 2012-12-22 ENCOUNTER — Encounter (HOSPITAL_COMMUNITY): Payer: Self-pay | Admitting: Ophthalmology

## 2013-10-24 ENCOUNTER — Other Ambulatory Visit: Payer: Self-pay | Admitting: Obstetrics & Gynecology

## 2013-10-25 LAB — CYTOLOGY - PAP

## 2013-10-28 ENCOUNTER — Other Ambulatory Visit: Payer: Self-pay | Admitting: Obstetrics & Gynecology

## 2013-10-28 DIAGNOSIS — R928 Other abnormal and inconclusive findings on diagnostic imaging of breast: Secondary | ICD-10-CM

## 2013-11-09 ENCOUNTER — Ambulatory Visit
Admission: RE | Admit: 2013-11-09 | Discharge: 2013-11-09 | Disposition: A | Discharge status: Home / Self Care | Payer: Medicare HMO | Source: Ambulatory Visit | Attending: Obstetrics & Gynecology | Admitting: Obstetrics & Gynecology

## 2013-11-09 ENCOUNTER — Encounter (INDEPENDENT_AMBULATORY_CARE_PROVIDER_SITE_OTHER): Payer: Self-pay

## 2013-11-09 DIAGNOSIS — R928 Other abnormal and inconclusive findings on diagnostic imaging of breast: Secondary | ICD-10-CM

## 2015-04-09 DIAGNOSIS — L570 Actinic keratosis: Secondary | ICD-10-CM | POA: Diagnosis not present

## 2015-04-09 DIAGNOSIS — L821 Other seborrheic keratosis: Secondary | ICD-10-CM | POA: Diagnosis not present

## 2015-04-09 DIAGNOSIS — D485 Neoplasm of uncertain behavior of skin: Secondary | ICD-10-CM | POA: Diagnosis not present

## 2015-04-22 ENCOUNTER — Emergency Department (HOSPITAL_COMMUNITY): Payer: PPO

## 2015-04-22 ENCOUNTER — Encounter (HOSPITAL_COMMUNITY): Payer: Self-pay | Admitting: Emergency Medicine

## 2015-04-22 ENCOUNTER — Emergency Department (HOSPITAL_COMMUNITY)
Admission: EM | Admit: 2015-04-22 | Discharge: 2015-04-22 | Disposition: A | Discharge status: Home / Self Care | Payer: PPO | Attending: Emergency Medicine | Admitting: Emergency Medicine

## 2015-04-22 DIAGNOSIS — Z79899 Other long term (current) drug therapy: Secondary | ICD-10-CM | POA: Diagnosis not present

## 2015-04-22 DIAGNOSIS — I2510 Atherosclerotic heart disease of native coronary artery without angina pectoris: Secondary | ICD-10-CM | POA: Diagnosis not present

## 2015-04-22 DIAGNOSIS — Z9889 Other specified postprocedural states: Secondary | ICD-10-CM | POA: Diagnosis not present

## 2015-04-22 DIAGNOSIS — Z7984 Long term (current) use of oral hypoglycemic drugs: Secondary | ICD-10-CM | POA: Insufficient documentation

## 2015-04-22 DIAGNOSIS — E785 Hyperlipidemia, unspecified: Secondary | ICD-10-CM | POA: Diagnosis not present

## 2015-04-22 DIAGNOSIS — R05 Cough: Secondary | ICD-10-CM | POA: Diagnosis not present

## 2015-04-22 DIAGNOSIS — Z8673 Personal history of transient ischemic attack (TIA), and cerebral infarction without residual deficits: Secondary | ICD-10-CM | POA: Diagnosis not present

## 2015-04-22 DIAGNOSIS — K2100 Gastro-esophageal reflux disease with esophagitis, without bleeding: Secondary | ICD-10-CM

## 2015-04-22 DIAGNOSIS — Z7982 Long term (current) use of aspirin: Secondary | ICD-10-CM | POA: Insufficient documentation

## 2015-04-22 DIAGNOSIS — R072 Precordial pain: Secondary | ICD-10-CM | POA: Diagnosis not present

## 2015-04-22 DIAGNOSIS — I10 Essential (primary) hypertension: Secondary | ICD-10-CM | POA: Insufficient documentation

## 2015-04-22 DIAGNOSIS — R079 Chest pain, unspecified: Secondary | ICD-10-CM | POA: Diagnosis not present

## 2015-04-22 DIAGNOSIS — R0602 Shortness of breath: Secondary | ICD-10-CM | POA: Diagnosis not present

## 2015-04-22 LAB — CBC WITH DIFFERENTIAL/PLATELET
Basophils Absolute: 0 10*3/uL (ref 0.0–0.1)
Basophils Relative: 0 %
Eosinophils Absolute: 0.3 10*3/uL (ref 0.0–0.7)
Eosinophils Relative: 3 %
HCT: 37.1 % (ref 36.0–46.0)
Hemoglobin: 12.5 g/dL (ref 12.0–15.0)
Lymphocytes Relative: 19 %
Lymphs Abs: 2.1 10*3/uL (ref 0.7–4.0)
MCH: 27.8 pg (ref 26.0–34.0)
MCHC: 33.7 g/dL (ref 30.0–36.0)
MCV: 82.4 fL (ref 78.0–100.0)
Monocytes Absolute: 1.5 10*3/uL — ABNORMAL HIGH (ref 0.1–1.0)
Monocytes Relative: 13 %
Neutro Abs: 7 10*3/uL (ref 1.7–7.7)
Neutrophils Relative %: 65 %
Platelets: 229 10*3/uL (ref 150–400)
RBC: 4.5 MIL/uL (ref 3.87–5.11)
RDW: 13.2 % (ref 11.5–15.5)
WBC: 11 10*3/uL — ABNORMAL HIGH (ref 4.0–10.5)

## 2015-04-22 LAB — COMPREHENSIVE METABOLIC PANEL
ALT: 14 U/L (ref 14–54)
AST: 19 U/L (ref 15–41)
Albumin: 3.7 g/dL (ref 3.5–5.0)
Alkaline Phosphatase: 55 U/L (ref 38–126)
Anion gap: 9 (ref 5–15)
BUN: 22 mg/dL — ABNORMAL HIGH (ref 6–20)
CO2: 25 mmol/L (ref 22–32)
Calcium: 9 mg/dL (ref 8.9–10.3)
Chloride: 98 mmol/L — ABNORMAL LOW (ref 101–111)
Creatinine, Ser: 0.97 mg/dL (ref 0.44–1.00)
GFR calc Af Amer: >60 mL/min (ref >60)
GFR calc non Af Amer: 55 mL/min — ABNORMAL LOW (ref >60)
Glucose, Bld: 150 mg/dL — ABNORMAL HIGH (ref 65–99)
Potassium: 3.7 mmol/L (ref 3.5–5.1)
Sodium: 132 mmol/L — ABNORMAL LOW (ref 135–145)
Total Bilirubin: 0.6 mg/dL (ref 0.3–1.2)
Total Protein: 6.2 g/dL — ABNORMAL LOW (ref 6.5–8.1)

## 2015-04-22 LAB — D-DIMER, QUANTITATIVE: D-Dimer, Quant: 0.39 ug/mL-FEU (ref 0.00–0.50)

## 2015-04-22 LAB — PROTIME-INR
INR: 1 (ref 0.00–1.49)
Prothrombin Time: 13.4 seconds (ref 11.6–15.2)

## 2015-04-22 LAB — APTT: aPTT: 27 seconds (ref 24–37)

## 2015-04-22 LAB — TROPONIN I: Troponin I: <0.03 ng/mL (ref <0.031)

## 2015-04-22 LAB — I-STAT TROPONIN, ED: Troponin i, poc: 0 ng/mL (ref 0.00–0.08)

## 2015-04-22 MED ORDER — GI COCKTAIL ~~LOC~~
30.0000 mL | Freq: Once | ORAL | Status: AC
  Administered 2015-04-22: 30 mL via ORAL
  Filled 2015-04-22: qty 30

## 2015-04-22 MED ORDER — OMEPRAZOLE 20 MG PO CPDR: 20.0000 mg | DELAYED_RELEASE_CAPSULE | Freq: Every day | ORAL | Status: DC

## 2015-04-22 MED ORDER — ASPIRIN 81 MG PO CHEW
324.0000 mg | CHEWABLE_TABLET | Freq: Once | ORAL | Status: AC
  Administered 2015-04-22: 243 mg via ORAL
  Filled 2015-04-22: qty 4

## 2015-04-22 NOTE — Discharge Instructions (Signed)
Esophagitis °Esophagitis is inflammation of the esophagus. The esophagus is the tube that carries food and liquids from your mouth to your stomach. Esophagitis can cause soreness or pain in the esophagus. This condition can make it difficult and painful to swallow.  °CAUSES °Most causes of esophagitis are not serious. Common causes of this condition include: °· Gastroesophageal reflux disease (GERD). This is when stomach contents move back up into the esophagus (reflux). °· Repeated vomiting. °· An allergic-type reaction, especially caused by food allergies (eosinophilic esophagitis). °· Injury to the esophagus by swallowing large pills with or without water, or swallowing certain types of medicines. °· Swallowing (ingesting) harmful chemicals, such as household cleaning products. °· Heavy alcohol use. °· An infection of the esophagus. This most often occurs in people who have a weakened immune system. °· Radiation or chemotherapy treatment for cancer. °· Certain diseases such as sarcoidosis, Crohn disease, and scleroderma. °SYMPTOMS °Symptoms of this condition include: °· Difficult or painful swallowing. °· Pain with swallowing acidic liquids, such as citrus juices. °· Pain with burping. °· Chest pain. °· Difficulty breathing. °· Nausea. °· Vomiting. °· Pain in the abdomen. °· Weight loss. °· Ulcers in the mouth. °· Patches of white material in the mouth (candidiasis). °· Fever. °· Coughing up blood or vomiting blood. °· Stool that is black, tarry, or bright red. °DIAGNOSIS °Your health care provider will take a medical history and perform a physical exam. You may also have other tests, including: °· An endoscopy to examine your stomach and esophagus with a small camera. °· A test that measures the acidity level in your esophagus. °· A test that measures how much pressure is on your esophagus. °· A barium swallow or modified barium swallow to show the shape, size, and functioning of your esophagus. °· Allergy  tests. °TREATMENT °Treatment for this condition depends on the cause of your esophagitis. In some cases, steroids or other medicines may be given to help relieve your symptoms or to treat the underlying cause of your condition. You may have to make some lifestyle changes, such as: °· Avoiding alcohol. °· Quitting smoking. °· Changing your diet. °· Exercising. °· Changing your sleep habits and your sleep environment. °HOME CARE INSTRUCTIONS °Take these actions to decrease your discomfort and to help avoid complications. °Diet °· Follow a diet as recommended by your health care provider. This may involve avoiding foods and drinks such as: °¨ Coffee and tea (with or without caffeine). °¨ Drinks that contain alcohol. °¨ Energy drinks and sports drinks. °¨ Carbonated drinks or sodas. °¨ Chocolate and cocoa. °¨ Peppermint and mint flavorings. °¨ Garlic and onions. °¨ Horseradish. °¨ Spicy and acidic foods, including peppers, chili powder, curry powder, vinegar, hot sauces, and barbecue sauce. °¨ Citrus fruit juices and citrus fruits, such as oranges, lemons, and limes. °¨ Tomato-based foods, such as red sauce, chili, salsa, and pizza with red sauce. °¨ Fried and fatty foods, such as donuts, french fries, potato chips, and high-fat dressings. °¨ High-fat meats, such as hot dogs and fatty cuts of red and white meats, such as rib eye steak, sausage, ham, and bacon. °¨ High-fat dairy items, such as whole milk, butter, and cream cheese. °· Eat small, frequent meals instead of large meals. °· Avoid drinking large amounts of liquid with your meals. °· Avoid eating meals during the 2-3 hours before bedtime. °· Avoid lying down right after you eat. °· Do not exercise right after you eat. °· Avoid foods and drinks that seem to   make your symptoms worse. °General Instructions °· Pay attention to any changes in your symptoms. °· Take over-the-counter and prescription medicines only as told by your health care provider. Do not take  aspirin, ibuprofen, or other NSAIDs unless your health care provider told you to do so. °· If you have trouble taking pills, use a pill splitter to decrease the size of the pill. This will decrease the chance of the pill getting stuck or injuring your esophagus on the way down. Also, drink water after you take a pill. °· Do not use any tobacco products, including cigarettes, chewing tobacco, and e-cigarettes. If you need help quitting, ask your health care provider. °· Wear loose-fitting clothing. Do not wear anything tight around your waist that causes pressure on your abdomen. °· Raise (elevate) the head of your bed about 6 inches (15 cm). °· Try to reduce your stress, such as with yoga or meditation. If you need help reducing stress, ask your health care provider. °· If you are overweight, reduce your weight to an amount that is healthy for you. Ask your health care provider for guidance about a safe weight loss goal. °· Keep all follow-up visits as told by your health care provider. This is important. °SEEK MEDICAL CARE IF: °· You have new symptoms. °· You have unexplained weight loss. °· You have difficulty swallowing, or it hurts to swallow. °· You have wheezing or a persistent cough. °· Your symptoms do not improve with treatment. °· You have frequent heartburn for more than two weeks. °SEEK IMMEDIATE MEDICAL CARE IF: °· You have severe pain in your arms, neck, jaw, teeth, or back. °· You feel sweaty, dizzy, or light-headed. °· You have chest pain or shortness of breath. °· You vomit and your vomit looks like blood or coffee grounds. °· Your stool is bloody or black. °· You have a fever. °· You cannot swallow, drink, or eat. °  °This information is not intended to replace advice given to you by your health care provider. Make sure you discuss any questions you have with your health care provider. °  °Document Released: 04/03/2004 Document Revised: 11/15/2014 Document Reviewed: 06/21/2014 °Elsevier Interactive  Patient Education ©2016 [employer] ° °

## 2015-04-22 NOTE — ED Provider Notes (Signed)
CSN: [id number]     Arrival date & time 04/22/15  1734 History   First MD Initiated Contact with Patient 04/22/15 1757     Chief Complaint  Patient presents with  . Chest Pain    Patient is a 81 y.o. female presenting with chest pain. The history is provided by the patient.  Chest Pain Pain location:  Substernal area Pain quality: aching and sharp   Pain radiates to:  Neck and R arm Pain severity:  Moderate Duration:  16 hours Timing:  Constant Context comment:  Worse with swallowing or coughing Relieved by:  Nothing Associated symptoms: cough   Associated symptoms: no abdominal pain, no fever and no shortness of breath   Risk factors: no prior DVT/PE   Pt does have a history of non obstructive CAD.  She had a cath in 2009 that did not show any significant disease.  Past Medical History  Diagnosis Date  . Coronary artery disease     LVEF  65%  . Hyperlipidemia   . GERD (gastroesophageal reflux disease)   . Cerebrovascular disease     followed at [facility]  . Hypertension    Past Surgical History  Procedure Laterality Date  . Cholecystectomy    . Carotid endarterectomy      left  . Colonoscopy N/A 06/29/2012    Procedure: COLONOSCOPY;  Surgeon: Rogene Houston, MD;  Location: [facility] SUITE;  Service: Endoscopy;  Laterality: N/A;  1200-moved to 1215 [name] to notify pt  . Carotid stent insertion Left   . Cardiac catheterization    . Appendectomy    . Cataract extraction w/phaco Right 11/30/2012    Procedure: CATARACT EXTRACTION PHACO AND INTRAOCULAR LENS PLACEMENT (IOC);  Surgeon: Elta Guadeloupe [name], MD;  Location: [facility] ORS;  Service: Ophthalmology;  Laterality: Right;  CDE:12.71  . Cataract extraction w/phaco Left 12/21/2012    Procedure: CATARACT EXTRACTION PHACO AND INTRAOCULAR LENS PLACEMENT (IOC);  Surgeon: Elta Guadeloupe [name], MD;  Location: [facility] ORS;  Service: Ophthalmology;  Laterality: Left;  CDE:12.65   Family History  Problem Relation Age of Onset  . Heart disease Mother   .  Cerebrovascular Disease Father   . Cancer Sister   . Cerebrovascular Disease Brother   . Diabetes Brother   . Heart attack Brother   . Liver disease Brother    Social History  Substance Use Topics  . Smoking status: Never Smoker   . Smokeless tobacco: Never Used  . Alcohol Use: No   OB History    No data available     Review of Systems  Constitutional: Negative for fever.  Respiratory: Positive for cough. Negative for shortness of breath.   Cardiovascular: Positive for chest pain.  Gastrointestinal: Negative for abdominal pain.  All other systems reviewed and are negative.     Allergies  Codeine  Home Medications   Prior to Admission medications   Medication Sig Start Date End Date Taking? Authorizing Provider  acetaminophen (TYLENOL) 500 MG tablet Take 1,000 mg by mouth every 6 (six) hours as needed for mild pain.   Yes Historical Provider, MD  Apoaequorin (PREVAGEN) 10 MG CAPS Take 10 mg by mouth daily.   Yes Historical Provider, MD  aspirin (ASPIRIN EC) 81 MG EC tablet Take 81 mg by mouth daily. Swallow whole.   Yes Historical Provider, MD  atenolol-chlorthalidone (TENORETIC) 50-25 MG per tablet Take 1 tablet by mouth daily.   Yes Historical Provider, MD  metFORMIN (GLUCOPHAGE) 500 MG tablet Take 500 mg  by mouth daily with breakfast.   Yes Historical Provider, MD  Multiple Vitamins-Minerals (PRESERVISION AREDS 2) CAPS Take 1 capsule by mouth daily.   Yes Historical Provider, MD  Omega-3 Fatty Acids (FISH OIL) 1000 MG CAPS Take 1,000 mg by mouth 2 (two) times daily.    Yes Historical Provider, MD  triamcinolone lotion (KENALOG) 0.1 % Apply 1 application topically 2 (two) times daily as needed (Dry Skin).  06/07/12  Yes Historical Provider, MD  meclizine (ANTIVERT) 25 MG tablet Take 25 mg by mouth 3 (three) times daily as needed for dizziness.     Historical Provider, MD  omeprazole (PRILOSEC) 20 MG capsule Take 1 capsule (20 mg total) by mouth daily. 04/22/15   Dorie Rank,  MD  propranolol (INDERAL) 20 MG tablet Take 20 mg by mouth as directed. Taking 3 to 4 times a day as needed for heart palpitations    Historical Provider, MD   BP 130/51 mmHg  Pulse 66  Temp(Src) 97.8 F (36.6 C) (Oral)  Resp 14  Ht 5\' 5"  (1.651 m)  Wt 68.947 kg  BMI 25.29 kg/m2  SpO2 97% Physical Exam  Constitutional: She appears well-developed and well-nourished. No distress.  HENT:  Head: Normocephalic and atraumatic.  Right Ear: External ear normal.  Left Ear: External ear normal.  Eyes: Conjunctivae are normal. Right eye exhibits no discharge. Left eye exhibits no discharge. No scleral icterus.  Neck: Neck supple. No tracheal deviation present.  Cardiovascular: Normal rate, regular rhythm and intact distal pulses.   Pulmonary/Chest: Effort normal and breath sounds normal. No stridor. No respiratory distress. She has no wheezes. She has no rales.  Abdominal: Soft. Bowel sounds are normal. She exhibits no distension. There is no tenderness. There is no rebound and no guarding.  Musculoskeletal: She exhibits no edema or tenderness.  Neurological: She is alert. She has normal strength. No cranial nerve deficit (no facial droop, extraocular movements intact, no slurred speech) or sensory deficit. She exhibits normal muscle tone. She displays no seizure activity. Coordination normal.  Skin: Skin is warm and dry. No rash noted.  Psychiatric: She has a normal mood and affect.  Nursing note and vitals reviewed.   ED Course  Procedures (including critical care time) Labs Review Labs Reviewed  CBC WITH DIFFERENTIAL/PLATELET - Abnormal; Notable for the following:    WBC 11.0 (*)    Monocytes Absolute 1.5 (*)    All other components within normal limits  COMPREHENSIVE METABOLIC PANEL - Abnormal; Notable for the following:    Sodium 132 (*)    Chloride 98 (*)    Glucose, Bld 150 (*)    BUN 22 (*)    Total Protein 6.2 (*)    GFR calc non Af Amer 55 (*)    All other components  within normal limits  TROPONIN I  APTT  PROTIME-INR  D-DIMER, QUANTITATIVE (NOT AT [facility])  Randolm Idol, ED    Imaging Review Dg Chest Portable 1 View  04/22/2015  CLINICAL DATA:  Chest pain and shortness of breath earlier today. Now with throat and shoulder pain. EXAM: PORTABLE CHEST 1 VIEW COMPARISON:  None. FINDINGS: The cardiac silhouette is likely within normal limits in size for AP technique and degree of lung inflation. The lungs are hypoinflated with mild central pulmonary vascular prominence and bronchovascular crowding. There is mild, patchy left basilar opacity. No sizable pleural effusion or pneumothorax is identified. No acute osseous abnormality is seen. IMPRESSION: Low lung volumes with left basilar atelectasis versus early  pneumonia. Electronically Signed   By: Logan Bores M.D.   On: 04/22/2015 18:37   I have personally reviewed and evaluated these images and lab results as part of my medical decision-making.   EKG Interpretation   Date/Time:  Sunday April 22 2015 17:44:02 EST Ventricular Rate:  64 PR Interval:  152 QRS Duration: 74 QT Interval:  398 QTC Calculation: 410 R Axis:   50 Text Interpretation:  Normal sinus rhythm Normal ECG No significant change  since last tracing Confirmed by Trinidy Masterson  MD-J, Kaius Daino [id number]) on 04/22/2015  5:56:05 PM      MDM   Final diagnoses:  Gastroesophageal reflux disease with esophagitis    She had chest pain for most of the day and has had 2 sets of cardiac enzymes that were normal. I doubt that her symptoms are related to acute coronary syndrome. D-dimer is negative. I doubt pulmonary embolism. Patient's symptoms improved after she was given a GI cocktail. Her symptoms were atypical for cardiac disease and are suggestive of esophagitis   Plan on discharge home with prescription for Prilosec. Follow up with her primary doctor later this week. Return as needed for worsening symptoms.    Dorie Rank, MD 04/22/15 2005

## 2015-04-22 NOTE — ED Notes (Signed)
Patient c/o central chest pain that radiates into right neck and right shoulder. Per patient started this morning. Reports shortness of breath. Per patient has had a heart cath done in past that showed arteries were clear. Denies any other cardiac hx.

## 2015-05-30 DIAGNOSIS — E119 Type 2 diabetes mellitus without complications: Secondary | ICD-10-CM | POA: Diagnosis not present

## 2015-06-01 DIAGNOSIS — I10 Essential (primary) hypertension: Secondary | ICD-10-CM | POA: Diagnosis not present

## 2015-06-01 DIAGNOSIS — E119 Type 2 diabetes mellitus without complications: Secondary | ICD-10-CM | POA: Diagnosis not present

## 2015-06-01 DIAGNOSIS — Z0000 Encounter for general adult medical examination without abnormal findings: Secondary | ICD-10-CM | POA: Diagnosis not present

## 2015-06-01 DIAGNOSIS — Z6824 Body mass index (BMI) 24.0-24.9, adult: Secondary | ICD-10-CM | POA: Diagnosis not present

## 2015-06-01 DIAGNOSIS — Z1389 Encounter for screening for other disorder: Secondary | ICD-10-CM | POA: Diagnosis not present

## 2015-06-12 DIAGNOSIS — Z961 Presence of intraocular lens: Secondary | ICD-10-CM | POA: Diagnosis not present

## 2015-06-12 DIAGNOSIS — E119 Type 2 diabetes mellitus without complications: Secondary | ICD-10-CM | POA: Diagnosis not present

## 2015-06-12 DIAGNOSIS — H26493 Other secondary cataract, bilateral: Secondary | ICD-10-CM | POA: Diagnosis not present

## 2015-06-21 DIAGNOSIS — I6521 Occlusion and stenosis of right carotid artery: Secondary | ICD-10-CM | POA: Diagnosis not present

## 2015-06-21 DIAGNOSIS — Z9889 Other specified postprocedural states: Secondary | ICD-10-CM | POA: Diagnosis not present

## 2015-06-21 DIAGNOSIS — Z48812 Encounter for surgical aftercare following surgery on the circulatory system: Secondary | ICD-10-CM | POA: Diagnosis not present

## 2015-06-21 DIAGNOSIS — I6522 Occlusion and stenosis of left carotid artery: Secondary | ICD-10-CM | POA: Diagnosis not present

## 2015-07-03 ENCOUNTER — Ambulatory Visit (HOSPITAL_COMMUNITY)
Admission: RE | Admit: 2015-07-03 | Discharge: 2015-07-03 | Disposition: A | Discharge status: Home / Self Care | Payer: PPO | Source: Ambulatory Visit | Attending: Ophthalmology | Admitting: Ophthalmology

## 2015-07-03 ENCOUNTER — Encounter (HOSPITAL_COMMUNITY)
Admission: RE | Disposition: A | Discharge status: Home / Self Care | Payer: Self-pay | Source: Ambulatory Visit | Attending: Ophthalmology

## 2015-07-03 DIAGNOSIS — Z7982 Long term (current) use of aspirin: Secondary | ICD-10-CM | POA: Insufficient documentation

## 2015-07-03 DIAGNOSIS — I10 Essential (primary) hypertension: Secondary | ICD-10-CM | POA: Insufficient documentation

## 2015-07-03 DIAGNOSIS — Z79899 Other long term (current) drug therapy: Secondary | ICD-10-CM | POA: Diagnosis not present

## 2015-07-03 DIAGNOSIS — Z7984 Long term (current) use of oral hypoglycemic drugs: Secondary | ICD-10-CM | POA: Diagnosis not present

## 2015-07-03 DIAGNOSIS — E119 Type 2 diabetes mellitus without complications: Secondary | ICD-10-CM | POA: Insufficient documentation

## 2015-07-03 DIAGNOSIS — H26491 Other secondary cataract, right eye: Secondary | ICD-10-CM | POA: Diagnosis not present

## 2015-07-03 HISTORY — PX: YAG LASER APPLICATION: SHX6189

## 2015-07-03 SURGERY — TREATMENT, USING YAG LASER
Anesthesia: LOCAL | Laterality: Right

## 2015-07-03 MED ORDER — TROPICAMIDE 1 % OP SOLN
1.0000 [drp] | OPHTHALMIC | Status: AC
  Administered 2015-07-03 (×3): 1 [drp] via OPHTHALMIC

## 2015-07-03 MED ORDER — TROPICAMIDE 1 % OP SOLN
OPHTHALMIC | Status: AC
  Filled 2015-07-03: qty 3

## 2015-07-03 NOTE — H&P (Signed)
The patient was re examined and there is no change in the patients condition since the original H and P. 

## 2015-07-03 NOTE — Op Note (Signed)
Jill Stansbery [name], MD  Procedure: Yag Capsulotomy  Yag Laser Self Test Completedyes. Procedure: Posterior Capsulotomy, Eye Protection Worn by Staff yes. Laser In Use Sign on Door yes.  Laser: Nd:YAG Spot Size: Fixed Burst Mode: III Power Setting: 3.4 mJ/burst Number of shots: 20 Total energy delivered: 66.52 mJ   The patient tolerated the procedure without difficulty. No complications were encountered.   The patient was discharged home with the instructions to continue all her current glaucoma medications, if any.   Patient instructed to go to office at 0100 for intraocular pressure check.  Patient verbalizes understanding of discharge instructions Yes.  .    Pre-Operative Diagnosis: After-Cataract, obscuring vision, 366.53 OD Post-Operative Diagnosis: After-Cataract, obscuring vision, 366.53 OD Date of Cataract Surgery: 11/30/2012

## 2015-07-03 NOTE — Discharge Instructions (Signed)
LIORA AMRINE  07/03/2015     Instructions    Activity: No Restrictions.   Diet: Resume Diet you were on at home.   Pain Medication: Tylenol if Needed.   CONTACT YOUR DOCTOR IF YOU HAVE PAIN, REDNESS IN YOUR EYE, OR DECREASED VISION.      Dr. Gershon [id number]    If you find that you cannot contact your physician, but feel that your signs and   Symptoms warrant a physician's attention, call the Emergency Room at   [phone] ext.[phone].

## 2015-07-05 ENCOUNTER — Encounter (HOSPITAL_COMMUNITY): Payer: Self-pay | Admitting: Ophthalmology

## 2015-07-17 ENCOUNTER — Ambulatory Visit (HOSPITAL_COMMUNITY)
Admission: RE | Admit: 2015-07-17 | Discharge: 2015-07-17 | Disposition: A | Discharge status: Home / Self Care | Payer: PPO | Source: Ambulatory Visit | Attending: Ophthalmology | Admitting: Ophthalmology

## 2015-07-17 ENCOUNTER — Encounter (HOSPITAL_COMMUNITY)
Admission: RE | Disposition: A | Discharge status: Home / Self Care | Payer: Self-pay | Source: Ambulatory Visit | Attending: Ophthalmology

## 2015-07-17 DIAGNOSIS — I10 Essential (primary) hypertension: Secondary | ICD-10-CM | POA: Insufficient documentation

## 2015-07-17 DIAGNOSIS — Z7982 Long term (current) use of aspirin: Secondary | ICD-10-CM | POA: Diagnosis not present

## 2015-07-17 DIAGNOSIS — Z79899 Other long term (current) drug therapy: Secondary | ICD-10-CM | POA: Insufficient documentation

## 2015-07-17 DIAGNOSIS — E119 Type 2 diabetes mellitus without complications: Secondary | ICD-10-CM | POA: Diagnosis not present

## 2015-07-17 DIAGNOSIS — Z7984 Long term (current) use of oral hypoglycemic drugs: Secondary | ICD-10-CM | POA: Insufficient documentation

## 2015-07-17 DIAGNOSIS — H26492 Other secondary cataract, left eye: Secondary | ICD-10-CM | POA: Diagnosis not present

## 2015-07-17 DIAGNOSIS — H2640 Unspecified secondary cataract: Secondary | ICD-10-CM | POA: Insufficient documentation

## 2015-07-17 HISTORY — PX: YAG LASER APPLICATION: SHX6189

## 2015-07-17 SURGERY — TREATMENT, USING YAG LASER
Anesthesia: LOCAL | Laterality: Left

## 2015-07-17 MED ORDER — CYCLOPENTOLATE-PHENYLEPHRINE OP SOLN OPTIME - NO CHARGE
OPHTHALMIC | Status: AC
  Filled 2015-07-17: qty 2

## 2015-07-17 MED ORDER — CYCLOPENTOLATE-PHENYLEPHRINE 0.2-1 % OP SOLN
1.0000 [drp] | OPHTHALMIC | Status: AC
  Administered 2015-07-17 (×3): 1 [drp] via OPHTHALMIC

## 2015-07-17 NOTE — Discharge Instructions (Signed)
Jill Fisher  07/17/2015     Instructions    Activity: No Restrictions.   Diet: Resume Diet you were on at home.   Pain Medication: Tylenol if Needed.   CONTACT YOUR DOCTOR IF YOU HAVE PAIN, REDNESS IN YOUR EYE, OR DECREASED VISION.   Follow-up:today with Rutherford Guys, MD.   Dr. Gershon Crane: [phone]    If you find that you cannot contact your physician, but feel that your signs and   Symptoms warrant a physician's attention, call the Emergency Room at   [phone] [phone].   Othern/a.

## 2015-07-17 NOTE — H&P (Signed)
The patient was re examined and there is no change in the patients condition since the original H and P. 

## 2015-07-17 NOTE — Op Note (Signed)
Jill Fisher [name], MD  Procedure: Yag Capsulotomy  Yag Laser Self Test Completedyes. Procedure: Posterior Capsulotomy, Eye Protection Worn by Staff yes. Laser In Use Sign on Door yes.  Laser: Nd:YAG Spot Size: Fixed Burst Mode: III Power Setting: 3.4 mJ/burst Number of shots: 21 Total energy delivered: 69.12 mJ   The patient tolerated the procedure without difficulty. No complications were encountered.   The patient was discharged home with the instructions to continue all her current glaucoma medications, if any.   Patient instructed to go to office at 0100 for intraocular pressure check.  Patient verbalizes understanding of discharge instructions Yes.  .    Pre-Operative Diagnosis: After-Cataract, obscuring vision, 366.53 OS Post-Operative Diagnosis: After-Cataract, obscuring vision, 366.53 OS Date of Cataract Surgery: 12/21/2012

## 2015-07-18 ENCOUNTER — Encounter (HOSPITAL_COMMUNITY): Payer: Self-pay | Admitting: Ophthalmology

## 2015-08-14 DIAGNOSIS — L259 Unspecified contact dermatitis, unspecified cause: Secondary | ICD-10-CM | POA: Diagnosis not present

## 2015-08-14 DIAGNOSIS — L570 Actinic keratosis: Secondary | ICD-10-CM | POA: Diagnosis not present

## 2015-09-07 DIAGNOSIS — Z6824 Body mass index (BMI) 24.0-24.9, adult: Secondary | ICD-10-CM | POA: Diagnosis not present

## 2015-09-07 DIAGNOSIS — I10 Essential (primary) hypertension: Secondary | ICD-10-CM | POA: Diagnosis not present

## 2015-09-07 DIAGNOSIS — M791 Myalgia: Secondary | ICD-10-CM | POA: Diagnosis not present

## 2015-09-07 DIAGNOSIS — E119 Type 2 diabetes mellitus without complications: Secondary | ICD-10-CM | POA: Diagnosis not present

## 2015-11-15 DIAGNOSIS — I10 Essential (primary) hypertension: Secondary | ICD-10-CM | POA: Diagnosis not present

## 2015-11-15 DIAGNOSIS — E119 Type 2 diabetes mellitus without complications: Secondary | ICD-10-CM | POA: Diagnosis not present

## 2015-11-15 DIAGNOSIS — M791 Myalgia: Secondary | ICD-10-CM | POA: Diagnosis not present

## 2015-11-21 DIAGNOSIS — Z1231 Encounter for screening mammogram for malignant neoplasm of breast: Secondary | ICD-10-CM | POA: Diagnosis not present

## 2015-11-21 DIAGNOSIS — Z6826 Body mass index (BMI) 26.0-26.9, adult: Secondary | ICD-10-CM | POA: Diagnosis not present

## 2015-11-21 DIAGNOSIS — Z124 Encounter for screening for malignant neoplasm of cervix: Secondary | ICD-10-CM | POA: Diagnosis not present

## 2015-11-21 DIAGNOSIS — N390 Urinary tract infection, site not specified: Secondary | ICD-10-CM | POA: Diagnosis not present

## 2015-12-14 DIAGNOSIS — I10 Essential (primary) hypertension: Secondary | ICD-10-CM | POA: Diagnosis not present

## 2015-12-14 DIAGNOSIS — E119 Type 2 diabetes mellitus without complications: Secondary | ICD-10-CM | POA: Diagnosis not present

## 2015-12-14 DIAGNOSIS — Z6824 Body mass index (BMI) 24.0-24.9, adult: Secondary | ICD-10-CM | POA: Diagnosis not present

## 2016-01-02 DIAGNOSIS — E119 Type 2 diabetes mellitus without complications: Secondary | ICD-10-CM | POA: Diagnosis not present

## 2016-01-02 DIAGNOSIS — I10 Essential (primary) hypertension: Secondary | ICD-10-CM | POA: Diagnosis not present

## 2016-01-02 DIAGNOSIS — M791 Myalgia: Secondary | ICD-10-CM | POA: Diagnosis not present

## 2016-03-07 DIAGNOSIS — I10 Essential (primary) hypertension: Secondary | ICD-10-CM | POA: Diagnosis not present

## 2016-03-07 DIAGNOSIS — M791 Myalgia: Secondary | ICD-10-CM | POA: Diagnosis not present

## 2016-03-07 DIAGNOSIS — E119 Type 2 diabetes mellitus without complications: Secondary | ICD-10-CM | POA: Diagnosis not present

## 2016-03-24 DIAGNOSIS — Z6823 Body mass index (BMI) 23.0-23.9, adult: Secondary | ICD-10-CM | POA: Diagnosis not present

## 2016-03-24 DIAGNOSIS — J218 Acute bronchiolitis due to other specified organisms: Secondary | ICD-10-CM | POA: Diagnosis not present

## 2016-04-15 DIAGNOSIS — I10 Essential (primary) hypertension: Secondary | ICD-10-CM | POA: Diagnosis not present

## 2016-04-15 DIAGNOSIS — E119 Type 2 diabetes mellitus without complications: Secondary | ICD-10-CM | POA: Diagnosis not present

## 2016-06-06 ENCOUNTER — Encounter (HOSPITAL_COMMUNITY): Payer: Self-pay | Admitting: Emergency Medicine

## 2016-06-06 ENCOUNTER — Emergency Department (HOSPITAL_COMMUNITY): Payer: PPO

## 2016-06-06 ENCOUNTER — Emergency Department (HOSPITAL_COMMUNITY)
Admission: EM | Admit: 2016-06-06 | Discharge: 2016-06-06 | Disposition: A | Discharge status: Home / Self Care | Payer: PPO | Attending: Emergency Medicine | Admitting: Emergency Medicine

## 2016-06-06 DIAGNOSIS — Z7984 Long term (current) use of oral hypoglycemic drugs: Secondary | ICD-10-CM | POA: Insufficient documentation

## 2016-06-06 DIAGNOSIS — J40 Bronchitis, not specified as acute or chronic: Secondary | ICD-10-CM

## 2016-06-06 DIAGNOSIS — R079 Chest pain, unspecified: Secondary | ICD-10-CM | POA: Diagnosis not present

## 2016-06-06 DIAGNOSIS — I10 Essential (primary) hypertension: Secondary | ICD-10-CM | POA: Insufficient documentation

## 2016-06-06 DIAGNOSIS — Z7982 Long term (current) use of aspirin: Secondary | ICD-10-CM | POA: Insufficient documentation

## 2016-06-06 DIAGNOSIS — I2510 Atherosclerotic heart disease of native coronary artery without angina pectoris: Secondary | ICD-10-CM | POA: Insufficient documentation

## 2016-06-06 DIAGNOSIS — R05 Cough: Secondary | ICD-10-CM | POA: Diagnosis not present

## 2016-06-06 DIAGNOSIS — R0689 Other abnormalities of breathing: Secondary | ICD-10-CM | POA: Insufficient documentation

## 2016-06-06 DIAGNOSIS — R072 Precordial pain: Secondary | ICD-10-CM | POA: Diagnosis not present

## 2016-06-06 DIAGNOSIS — J209 Acute bronchitis, unspecified: Secondary | ICD-10-CM | POA: Diagnosis not present

## 2016-06-06 LAB — CBC WITH DIFFERENTIAL/PLATELET
Basophils Absolute: 0 10*3/uL (ref 0.0–0.1)
Basophils Relative: 0 %
Eosinophils Absolute: 0.6 10*3/uL (ref 0.0–0.7)
Eosinophils Relative: 6 %
HCT: 38.1 % (ref 36.0–46.0)
Hemoglobin: 13 g/dL (ref 12.0–15.0)
Lymphocytes Relative: 21 %
Lymphs Abs: 2.1 10*3/uL (ref 0.7–4.0)
MCH: 27.8 pg (ref 26.0–34.0)
MCHC: 34.1 g/dL (ref 30.0–36.0)
MCV: 81.6 fL (ref 78.0–100.0)
Monocytes Absolute: 0.6 10*3/uL (ref 0.1–1.0)
Monocytes Relative: 6 %
Neutro Abs: 6.7 10*3/uL (ref 1.7–7.7)
Neutrophils Relative %: 67 %
Platelets: 228 10*3/uL (ref 150–400)
RBC: 4.67 MIL/uL (ref 3.87–5.11)
RDW: 13.8 % (ref 11.5–15.5)
WBC: 10.1 10*3/uL (ref 4.0–10.5)

## 2016-06-06 LAB — BASIC METABOLIC PANEL
Anion gap: 11 (ref 5–15)
BUN: 17 mg/dL (ref 6–20)
CO2: 25 mmol/L (ref 22–32)
Calcium: 9.4 mg/dL (ref 8.9–10.3)
Chloride: 97 mmol/L — ABNORMAL LOW (ref 101–111)
Creatinine, Ser: 0.7 mg/dL (ref 0.44–1.00)
GFR calc Af Amer: >60 mL/min (ref >60)
GFR calc non Af Amer: >60 mL/min (ref >60)
Glucose, Bld: 102 mg/dL — ABNORMAL HIGH (ref 65–99)
Potassium: 3.7 mmol/L (ref 3.5–5.1)
Sodium: 133 mmol/L — ABNORMAL LOW (ref 135–145)

## 2016-06-06 LAB — INFLUENZA PANEL BY PCR (TYPE A & B)
Influenza A By PCR: NEGATIVE
Influenza B By PCR: NEGATIVE

## 2016-06-06 LAB — BRAIN NATRIURETIC PEPTIDE: B Natriuretic Peptide: 105 pg/mL — ABNORMAL HIGH (ref 0.0–100.0)

## 2016-06-06 LAB — TROPONIN I
Troponin I: <0.03 ng/mL (ref <0.03)
Troponin I: <0.03 ng/mL (ref <0.03)

## 2016-06-06 MED ORDER — AZITHROMYCIN 250 MG PO TABS: 250.0000 mg | ORAL_TABLET | Freq: Every day | ORAL | 0 refills | Status: DC

## 2016-06-06 MED ORDER — KETOROLAC TROMETHAMINE 30 MG/ML IJ SOLN
15.0000 mg | Freq: Once | INTRAMUSCULAR | Status: AC
  Administered 2016-06-06: 15 mg via INTRAVENOUS
  Filled 2016-06-06: qty 1

## 2016-06-06 MED ORDER — BENZONATATE 100 MG PO CAPS: 100.0000 mg | ORAL_CAPSULE | Freq: Three times a day (TID) | ORAL | 0 refills | Status: DC

## 2016-06-06 NOTE — ED Triage Notes (Signed)
Patient complains of right sided chest pain that radiates to back.

## 2016-06-06 NOTE — ED Provider Notes (Signed)
[facility] DEPT Provider Note   CSN: [id number] Arrival date & time: 06/06/16  1503     History   Chief Complaint Chief Complaint  Patient presents with  . Chest Pain    HPI Jill Fisher is a 82 y.o. female.   Chest Pain   This is a recurrent problem. The current episode started yesterday. The problem occurs constantly. The problem has been gradually worsening. The pain is associated with coughing. The pain is present in the substernal region. The pain is mild. The quality of the pain is described as brief and sharp. The pain does not radiate. The symptoms are aggravated by deep breathing. Associated symptoms include cough. She has tried rest for the symptoms.    Past Medical History:  Diagnosis Date  . Cerebrovascular disease    followed at [facility]  . Coronary artery disease    LVEF  65%  . GERD (gastroesophageal reflux disease)   . Hyperlipidemia   . Hypertension     Patient Active Problem List   Diagnosis Date Noted  . Dizziness and giddiness 03/18/2012  . Disturbance of skin sensation 03/18/2012  . GERD (gastroesophageal reflux disease)   . Cerebrovascular disease   . PSVT 06/01/2009  . PALPITATIONS 05/15/2009  . CAROTID ARTERY DISEASE 03/08/2009  . HYPERLIPIDEMIA-MIXED 12/21/2008  . CAD, NATIVE VESSEL 12/21/2008  . CHEST PAIN-UNSPECIFIED 12/21/2008    Past Surgical History:  Procedure Laterality Date  . APPENDECTOMY    . CARDIAC CATHETERIZATION    . CAROTID ENDARTERECTOMY     left  . CAROTID STENT INSERTION Left   . CATARACT EXTRACTION W/PHACO Right 11/30/2012   Procedure: CATARACT EXTRACTION PHACO AND INTRAOCULAR LENS PLACEMENT (IOC);  Surgeon: Elta Guadeloupe [name], MD;  Location: [facility];  Service: Ophthalmology;  Laterality: Right;  CDE:12.71  . CATARACT EXTRACTION W/PHACO Left 12/21/2012   Procedure: CATARACT EXTRACTION PHACO AND INTRAOCULAR LENS PLACEMENT (IOC);  Surgeon: Elta Guadeloupe [name], MD;  Location: [facility];  Service: Ophthalmology;  Laterality: Left;   CDE:12.65  . CHOLECYSTECTOMY    . COLONOSCOPY N/A 06/29/2012   Procedure: COLONOSCOPY;  Surgeon: Rogene Houston, MD;  Location: [facility] ENDO SUITE;  Service: Endoscopy;  Laterality: N/A;  1200-moved to 1215 [name] to notify pt  . YAG LASER APPLICATION Right 7/61/9509   Procedure: YAG LASER APPLICATION;  Surgeon: Rutherford Guys, MD;  Location: [facility] ORS;  Service: Ophthalmology;  Laterality: Right;  . YAG LASER APPLICATION Left 05/10/6710   Procedure: YAG LASER APPLICATION;  Surgeon: Rutherford Guys, MD;  Location: [facility];  Service: Ophthalmology;  Laterality: Left;    OB History    No data available       Home Medications    Prior to Admission medications   Medication Sig Start Date End Date Taking? Authorizing Provider  Apoaequorin (PREVAGEN) 10 MG CAPS Take 10 mg by mouth daily.   Yes Historical Provider, MD  aspirin (ASPIRIN EC) 81 MG EC tablet Take 81 mg by mouth daily. Swallow whole.   Yes Historical Provider, MD  atenolol-chlorthalidone (TENORETIC) 50-25 MG per tablet Take 1 tablet by mouth daily.   Yes Historical Provider, MD  citalopram (CELEXA) 10 MG tablet Take 10 mg by mouth daily.  05/30/16  Yes Historical Provider, MD  glimepiride (AMARYL) 1 MG tablet Take 1 mg by mouth daily with breakfast.  05/30/16  Yes Historical Provider, MD  lisinopril (PRINIVIL,ZESTRIL) 2.5 MG tablet Take 2.5 mg by mouth daily.  05/30/16  Yes Historical Provider, MD  meclizine (ANTIVERT) 25 MG  tablet Take 25 mg by mouth 3 (three) times daily as needed for dizziness.    Yes Historical Provider, MD  metFORMIN (GLUCOPHAGE) 1000 MG tablet Take 1,000 mg by mouth 2 (two) times daily with a meal.  05/30/16  Yes Historical Provider, MD  Multiple Vitamins-Minerals (PRESERVISION AREDS 2) CAPS Take 1 capsule by mouth daily.   Yes Historical Provider, MD  Omega-3 Fatty Acids (FISH OIL) 1000 MG CAPS Take 1,000 mg by mouth 2 (two) times daily.    Yes Historical Provider, MD  propranolol (INDERAL) 20 MG tablet Take 20 mg by mouth as  directed. Taking 3 to 4 times a day as needed for heart palpitations   Yes Historical Provider, MD  triamcinolone lotion (KENALOG) 0.1 % Apply 1 application topically 2 (two) times daily as needed (Dry Skin).  06/07/12  Yes Historical Provider, MD  azithromycin (ZITHROMAX) 250 MG tablet Take 1 tablet (250 mg total) by mouth daily. Take first 2 tablets together, then 1 every day until finished. 06/06/16   Merrily Pew, MD  benzonatate (TESSALON) 100 MG capsule Take 1 capsule (100 mg total) by mouth every 8 (eight) hours. 06/06/16   Merrily Pew, MD    Family History Family History  Problem Relation Age of Onset  . Heart disease Mother   . Cerebrovascular Disease Father   . Cancer Sister   . Cerebrovascular Disease Brother   . Diabetes Brother   . Heart attack Brother   . Liver disease Brother     Social History Social History  Substance Use Topics  . Smoking status: Never Smoker  . Smokeless tobacco: Never Used  . Alcohol use No     Allergies   Codeine   Review of Systems Review of Systems  Respiratory: Positive for cough.   Cardiovascular: Positive for chest pain.  All other systems reviewed and are negative.    Physical Exam Updated Vital Signs BP 137/74 (BP Location: Right Arm)   Pulse 66   Temp 98.5 F (36.9 C) (Oral)   Resp 18   Ht 5\' 7"  (1.702 m)   Wt 152 lb (68.9 kg)   SpO2 98%   BMI 23.81 kg/m   Physical Exam  Constitutional: She is oriented to person, place, and time. She appears well-developed and well-nourished.  HENT:  Head: Normocephalic and atraumatic.  Eyes: Conjunctivae and EOM are normal.  Neck: Normal range of motion.  Cardiovascular: Normal rate and regular rhythm.   Pulmonary/Chest: No stridor. No respiratory distress. She has wheezes (mild R>L).  Abdominal: She exhibits no distension.  Musculoskeletal: Normal range of motion. She exhibits no edema or deformity.  Neurological: She is alert and oriented to person, place, and time. No  cranial nerve deficit.  Skin: Skin is warm and dry.  Nursing note and vitals reviewed.    ED Treatments / Results  Labs (all labs ordered are listed, but only abnormal results are displayed) Labs Reviewed  BASIC METABOLIC PANEL - Abnormal; Notable for the following:       Result Value   Sodium 133 (*)    Chloride 97 (*)    Glucose, Bld 102 (*)    All other components within normal limits  BRAIN NATRIURETIC PEPTIDE - Abnormal; Notable for the following:    B Natriuretic Peptide 105.0 (*)    All other components within normal limits  CBC WITH DIFFERENTIAL/PLATELET  TROPONIN I  INFLUENZA PANEL BY PCR (TYPE A & B)  TROPONIN I    EKG  EKG Interpretation  Date/Time:  Friday June 06 2016 15:10:35 EDT Ventricular Rate:  66 PR Interval:  158 QRS Duration: 74 QT Interval:  396 QTC Calculation: 415 R Axis:   47 Text Interpretation:  Normal sinus rhythm Normal ECG Confirmed by [facility] MD, Corene Cornea [phone]) on 06/06/2016 3:33:08 PM       Radiology Dg Chest 2 View  Result Date: 06/06/2016 CLINICAL DATA:  Chest and right arm pain. EXAM: CHEST  2 VIEW COMPARISON:  04/22/2015 FINDINGS: The cardiac silhouette, mediastinal and hilar contours are within normal limits and stable. There is mild tortuosity and calcification of the thoracic aorta. The lungs are clear. No pleural effusion. Stable eventration of both hemidiaphragms. The bony thorax is intact. Moderate degenerative changes involving the right shoulder joint. Remote healed left rib fracture noted. IMPRESSION: No acute cardiopulmonary findings. Electronically Signed   By: Marijo Sanes M.D.   On: 06/06/2016 15:48    Procedures Procedures (including critical care time)  Medications Ordered in ED Medications  ketorolac (TORADOL) 30 MG/ML injection 15 mg (15 mg Intravenous Given 06/06/16 1938)     Initial Impression / Assessment and Plan / ED Course  I have reviewed the triage vital signs and the nursing notes.  Pertinent labs &  imaging results that were available during my care of the patient were reviewed by me and considered in my medical decision making (see chart for details).     Nonspecific chest pain. Likely related to bronchitis.  Low HEART score, atypical type pain, delta troponins negative, ecg unchanged without evidence of ischemia, all making ACS unlikely.  Low Well's Score, doubt PE.  No e/o Pneumonia, but will treat with z pack with cough and pleuritic pain.  Based on H&P, low concern for dissection, pneumothorax, pericarditis, boerhaves or other emergent causes for their symptoms at this time.    Final Clinical Impressions(s) / ED Diagnoses   Final diagnoses:  Bronchitis    New Prescriptions Discharge Medication List as of 06/06/2016  8:40 PM    START taking these medications   Details  azithromycin (ZITHROMAX) 250 MG tablet Take 1 tablet (250 mg total) by mouth daily. Take first 2 tablets together, then 1 every day until finished., Starting Fri 06/06/2016, Print    benzonatate (TESSALON) 100 MG capsule Take 1 capsule (100 mg total) by mouth every 8 (eight) hours., Starting Fri 06/06/2016, Print         Merrily Pew, MD 06/06/16 2104

## 2016-06-24 DIAGNOSIS — Z0000 Encounter for general adult medical examination without abnormal findings: Secondary | ICD-10-CM | POA: Diagnosis not present

## 2016-06-24 DIAGNOSIS — Z1389 Encounter for screening for other disorder: Secondary | ICD-10-CM | POA: Diagnosis not present

## 2016-06-24 DIAGNOSIS — F3289 Other specified depressive episodes: Secondary | ICD-10-CM | POA: Diagnosis not present

## 2016-06-24 DIAGNOSIS — I10 Essential (primary) hypertension: Secondary | ICD-10-CM | POA: Diagnosis not present

## 2016-06-24 DIAGNOSIS — E784 Other hyperlipidemia: Secondary | ICD-10-CM | POA: Diagnosis not present

## 2016-06-24 DIAGNOSIS — Z6824 Body mass index (BMI) 24.0-24.9, adult: Secondary | ICD-10-CM | POA: Diagnosis not present

## 2016-06-24 DIAGNOSIS — E119 Type 2 diabetes mellitus without complications: Secondary | ICD-10-CM | POA: Diagnosis not present

## 2016-06-26 DIAGNOSIS — I10 Essential (primary) hypertension: Secondary | ICD-10-CM | POA: Diagnosis not present

## 2016-06-26 DIAGNOSIS — M791 Myalgia: Secondary | ICD-10-CM | POA: Diagnosis not present

## 2016-06-26 DIAGNOSIS — E119 Type 2 diabetes mellitus without complications: Secondary | ICD-10-CM | POA: Diagnosis not present

## 2016-07-11 DIAGNOSIS — Z0000 Encounter for general adult medical examination without abnormal findings: Secondary | ICD-10-CM | POA: Diagnosis not present

## 2016-07-22 DIAGNOSIS — E119 Type 2 diabetes mellitus without complications: Secondary | ICD-10-CM | POA: Diagnosis not present

## 2016-07-22 DIAGNOSIS — Z961 Presence of intraocular lens: Secondary | ICD-10-CM | POA: Diagnosis not present

## 2016-07-28 DIAGNOSIS — E119 Type 2 diabetes mellitus without complications: Secondary | ICD-10-CM | POA: Diagnosis not present

## 2016-07-28 DIAGNOSIS — M791 Myalgia: Secondary | ICD-10-CM | POA: Diagnosis not present

## 2016-07-28 DIAGNOSIS — I10 Essential (primary) hypertension: Secondary | ICD-10-CM | POA: Diagnosis not present

## 2016-10-01 DIAGNOSIS — M9903 Segmental and somatic dysfunction of lumbar region: Secondary | ICD-10-CM | POA: Diagnosis not present

## 2016-10-01 DIAGNOSIS — M47816 Spondylosis without myelopathy or radiculopathy, lumbar region: Secondary | ICD-10-CM | POA: Diagnosis not present

## 2016-10-01 DIAGNOSIS — M5441 Lumbago with sciatica, right side: Secondary | ICD-10-CM | POA: Diagnosis not present

## 2016-10-02 DIAGNOSIS — M5441 Lumbago with sciatica, right side: Secondary | ICD-10-CM | POA: Diagnosis not present

## 2016-10-02 DIAGNOSIS — M9903 Segmental and somatic dysfunction of lumbar region: Secondary | ICD-10-CM | POA: Diagnosis not present

## 2016-10-02 DIAGNOSIS — M47816 Spondylosis without myelopathy or radiculopathy, lumbar region: Secondary | ICD-10-CM | POA: Diagnosis not present

## 2016-10-07 DIAGNOSIS — M9903 Segmental and somatic dysfunction of lumbar region: Secondary | ICD-10-CM | POA: Diagnosis not present

## 2016-10-07 DIAGNOSIS — M47816 Spondylosis without myelopathy or radiculopathy, lumbar region: Secondary | ICD-10-CM | POA: Diagnosis not present

## 2016-10-07 DIAGNOSIS — M5441 Lumbago with sciatica, right side: Secondary | ICD-10-CM | POA: Diagnosis not present

## 2016-10-09 DIAGNOSIS — M47816 Spondylosis without myelopathy or radiculopathy, lumbar region: Secondary | ICD-10-CM | POA: Diagnosis not present

## 2016-10-09 DIAGNOSIS — M9903 Segmental and somatic dysfunction of lumbar region: Secondary | ICD-10-CM | POA: Diagnosis not present

## 2016-10-09 DIAGNOSIS — M5441 Lumbago with sciatica, right side: Secondary | ICD-10-CM | POA: Diagnosis not present

## 2016-10-13 DIAGNOSIS — M5441 Lumbago with sciatica, right side: Secondary | ICD-10-CM | POA: Diagnosis not present

## 2016-10-13 DIAGNOSIS — M9903 Segmental and somatic dysfunction of lumbar region: Secondary | ICD-10-CM | POA: Diagnosis not present

## 2016-10-13 DIAGNOSIS — M47816 Spondylosis without myelopathy or radiculopathy, lumbar region: Secondary | ICD-10-CM | POA: Diagnosis not present

## 2016-10-15 DIAGNOSIS — M9903 Segmental and somatic dysfunction of lumbar region: Secondary | ICD-10-CM | POA: Diagnosis not present

## 2016-10-15 DIAGNOSIS — M25561 Pain in right knee: Secondary | ICD-10-CM | POA: Diagnosis not present

## 2016-10-15 DIAGNOSIS — M5441 Lumbago with sciatica, right side: Secondary | ICD-10-CM | POA: Diagnosis not present

## 2016-10-15 DIAGNOSIS — M47816 Spondylosis without myelopathy or radiculopathy, lumbar region: Secondary | ICD-10-CM | POA: Diagnosis not present

## 2016-10-17 DIAGNOSIS — M47816 Spondylosis without myelopathy or radiculopathy, lumbar region: Secondary | ICD-10-CM | POA: Diagnosis not present

## 2016-10-17 DIAGNOSIS — M5441 Lumbago with sciatica, right side: Secondary | ICD-10-CM | POA: Diagnosis not present

## 2016-10-17 DIAGNOSIS — M9903 Segmental and somatic dysfunction of lumbar region: Secondary | ICD-10-CM | POA: Diagnosis not present

## 2016-10-21 DIAGNOSIS — M5441 Lumbago with sciatica, right side: Secondary | ICD-10-CM | POA: Diagnosis not present

## 2016-10-21 DIAGNOSIS — M9903 Segmental and somatic dysfunction of lumbar region: Secondary | ICD-10-CM | POA: Diagnosis not present

## 2016-10-21 DIAGNOSIS — M47816 Spondylosis without myelopathy or radiculopathy, lumbar region: Secondary | ICD-10-CM | POA: Diagnosis not present

## 2016-10-23 DIAGNOSIS — M9903 Segmental and somatic dysfunction of lumbar region: Secondary | ICD-10-CM | POA: Diagnosis not present

## 2016-10-23 DIAGNOSIS — M47816 Spondylosis without myelopathy or radiculopathy, lumbar region: Secondary | ICD-10-CM | POA: Diagnosis not present

## 2016-10-23 DIAGNOSIS — M5441 Lumbago with sciatica, right side: Secondary | ICD-10-CM | POA: Diagnosis not present

## 2016-11-03 DIAGNOSIS — M47816 Spondylosis without myelopathy or radiculopathy, lumbar region: Secondary | ICD-10-CM | POA: Diagnosis not present

## 2016-11-03 DIAGNOSIS — M5441 Lumbago with sciatica, right side: Secondary | ICD-10-CM | POA: Diagnosis not present

## 2016-11-03 DIAGNOSIS — M9903 Segmental and somatic dysfunction of lumbar region: Secondary | ICD-10-CM | POA: Diagnosis not present

## 2016-12-04 DIAGNOSIS — I10 Essential (primary) hypertension: Secondary | ICD-10-CM | POA: Diagnosis not present

## 2016-12-04 DIAGNOSIS — E119 Type 2 diabetes mellitus without complications: Secondary | ICD-10-CM | POA: Diagnosis not present

## 2016-12-04 DIAGNOSIS — M791 Myalgia: Secondary | ICD-10-CM | POA: Diagnosis not present

## 2016-12-31 DIAGNOSIS — E113393 Type 2 diabetes mellitus with moderate nonproliferative diabetic retinopathy without macular edema, bilateral: Secondary | ICD-10-CM | POA: Diagnosis not present

## 2017-01-26 DIAGNOSIS — Z6824 Body mass index (BMI) 24.0-24.9, adult: Secondary | ICD-10-CM | POA: Diagnosis not present

## 2017-01-26 DIAGNOSIS — J40 Bronchitis, not specified as acute or chronic: Secondary | ICD-10-CM | POA: Diagnosis not present

## 2017-02-12 DIAGNOSIS — F418 Other specified anxiety disorders: Secondary | ICD-10-CM | POA: Diagnosis not present

## 2017-02-12 DIAGNOSIS — F320 Major depressive disorder, single episode, mild: Secondary | ICD-10-CM | POA: Diagnosis not present

## 2017-02-12 DIAGNOSIS — Z6824 Body mass index (BMI) 24.0-24.9, adult: Secondary | ICD-10-CM | POA: Diagnosis not present

## 2017-02-12 DIAGNOSIS — I10 Essential (primary) hypertension: Secondary | ICD-10-CM | POA: Diagnosis not present

## 2017-02-12 DIAGNOSIS — K210 Gastro-esophageal reflux disease with esophagitis: Secondary | ICD-10-CM | POA: Diagnosis not present

## 2017-02-12 DIAGNOSIS — E119 Type 2 diabetes mellitus without complications: Secondary | ICD-10-CM | POA: Diagnosis not present

## 2017-02-12 DIAGNOSIS — E1165 Type 2 diabetes mellitus with hyperglycemia: Secondary | ICD-10-CM | POA: Diagnosis not present

## 2017-02-12 DIAGNOSIS — J40 Bronchitis, not specified as acute or chronic: Secondary | ICD-10-CM | POA: Diagnosis not present

## 2017-02-13 DIAGNOSIS — K210 Gastro-esophageal reflux disease with esophagitis: Secondary | ICD-10-CM | POA: Diagnosis not present

## 2017-02-13 DIAGNOSIS — E119 Type 2 diabetes mellitus without complications: Secondary | ICD-10-CM | POA: Diagnosis not present

## 2017-02-13 DIAGNOSIS — I10 Essential (primary) hypertension: Secondary | ICD-10-CM | POA: Diagnosis not present

## 2017-05-04 DIAGNOSIS — I10 Essential (primary) hypertension: Secondary | ICD-10-CM | POA: Diagnosis not present

## 2017-05-04 DIAGNOSIS — E119 Type 2 diabetes mellitus without complications: Secondary | ICD-10-CM | POA: Diagnosis not present

## 2017-05-04 DIAGNOSIS — K210 Gastro-esophageal reflux disease with esophagitis: Secondary | ICD-10-CM | POA: Diagnosis not present

## 2017-05-20 DIAGNOSIS — Z6824 Body mass index (BMI) 24.0-24.9, adult: Secondary | ICD-10-CM | POA: Diagnosis not present

## 2017-05-20 DIAGNOSIS — F418 Other specified anxiety disorders: Secondary | ICD-10-CM | POA: Diagnosis not present

## 2017-05-20 DIAGNOSIS — F320 Major depressive disorder, single episode, mild: Secondary | ICD-10-CM | POA: Diagnosis not present

## 2017-05-20 DIAGNOSIS — I10 Essential (primary) hypertension: Secondary | ICD-10-CM | POA: Diagnosis not present

## 2017-05-20 DIAGNOSIS — K210 Gastro-esophageal reflux disease with esophagitis: Secondary | ICD-10-CM | POA: Diagnosis not present

## 2017-05-20 DIAGNOSIS — E119 Type 2 diabetes mellitus without complications: Secondary | ICD-10-CM | POA: Diagnosis not present

## 2017-06-03 DIAGNOSIS — C44622 Squamous cell carcinoma of skin of right upper limb, including shoulder: Secondary | ICD-10-CM | POA: Diagnosis not present

## 2017-06-03 DIAGNOSIS — K210 Gastro-esophageal reflux disease with esophagitis: Secondary | ICD-10-CM | POA: Diagnosis not present

## 2017-06-03 DIAGNOSIS — L570 Actinic keratosis: Secondary | ICD-10-CM | POA: Diagnosis not present

## 2017-06-03 DIAGNOSIS — F418 Other specified anxiety disorders: Secondary | ICD-10-CM | POA: Diagnosis not present

## 2017-06-03 DIAGNOSIS — I10 Essential (primary) hypertension: Secondary | ICD-10-CM | POA: Diagnosis not present

## 2017-06-03 DIAGNOSIS — E119 Type 2 diabetes mellitus without complications: Secondary | ICD-10-CM | POA: Diagnosis not present

## 2017-06-25 DIAGNOSIS — C44622 Squamous cell carcinoma of skin of right upper limb, including shoulder: Secondary | ICD-10-CM | POA: Diagnosis not present

## 2017-07-02 ENCOUNTER — Encounter (INDEPENDENT_AMBULATORY_CARE_PROVIDER_SITE_OTHER): Payer: Self-pay | Admitting: *Deleted

## 2017-07-02 DIAGNOSIS — I10 Essential (primary) hypertension: Secondary | ICD-10-CM | POA: Diagnosis not present

## 2017-07-02 DIAGNOSIS — F418 Other specified anxiety disorders: Secondary | ICD-10-CM | POA: Diagnosis not present

## 2017-07-02 DIAGNOSIS — K210 Gastro-esophageal reflux disease with esophagitis: Secondary | ICD-10-CM | POA: Diagnosis not present

## 2017-07-02 DIAGNOSIS — E119 Type 2 diabetes mellitus without complications: Secondary | ICD-10-CM | POA: Diagnosis not present

## 2017-07-07 DIAGNOSIS — E113393 Type 2 diabetes mellitus with moderate nonproliferative diabetic retinopathy without macular edema, bilateral: Secondary | ICD-10-CM | POA: Diagnosis not present

## 2017-07-09 DIAGNOSIS — L0100 Impetigo, unspecified: Secondary | ICD-10-CM | POA: Diagnosis not present

## 2017-07-18 DIAGNOSIS — Z7982 Long term (current) use of aspirin: Secondary | ICD-10-CM | POA: Diagnosis not present

## 2017-07-18 DIAGNOSIS — Z7984 Long term (current) use of oral hypoglycemic drugs: Secondary | ICD-10-CM | POA: Diagnosis not present

## 2017-07-18 DIAGNOSIS — R9431 Abnormal electrocardiogram [ECG] [EKG]: Secondary | ICD-10-CM | POA: Diagnosis not present

## 2017-07-18 DIAGNOSIS — F17210 Nicotine dependence, cigarettes, uncomplicated: Secondary | ICD-10-CM | POA: Diagnosis not present

## 2017-07-18 DIAGNOSIS — R319 Hematuria, unspecified: Secondary | ICD-10-CM | POA: Diagnosis not present

## 2017-07-18 DIAGNOSIS — N3001 Acute cystitis with hematuria: Secondary | ICD-10-CM | POA: Diagnosis not present

## 2017-07-18 DIAGNOSIS — Z79899 Other long term (current) drug therapy: Secondary | ICD-10-CM | POA: Diagnosis not present

## 2017-07-18 DIAGNOSIS — I10 Essential (primary) hypertension: Secondary | ICD-10-CM | POA: Diagnosis not present

## 2017-07-18 DIAGNOSIS — R1110 Vomiting, unspecified: Secondary | ICD-10-CM | POA: Diagnosis not present

## 2017-07-18 DIAGNOSIS — E785 Hyperlipidemia, unspecified: Secondary | ICD-10-CM | POA: Diagnosis not present

## 2017-07-18 DIAGNOSIS — N390 Urinary tract infection, site not specified: Secondary | ICD-10-CM | POA: Diagnosis not present

## 2017-07-18 DIAGNOSIS — R112 Nausea with vomiting, unspecified: Secondary | ICD-10-CM | POA: Diagnosis not present

## 2017-08-07 DIAGNOSIS — M19011 Primary osteoarthritis, right shoulder: Secondary | ICD-10-CM | POA: Diagnosis not present

## 2017-08-11 DIAGNOSIS — Z85828 Personal history of other malignant neoplasm of skin: Secondary | ICD-10-CM | POA: Diagnosis not present

## 2017-08-11 DIAGNOSIS — L570 Actinic keratosis: Secondary | ICD-10-CM | POA: Diagnosis not present

## 2017-08-11 DIAGNOSIS — L0100 Impetigo, unspecified: Secondary | ICD-10-CM | POA: Diagnosis not present

## 2017-08-12 ENCOUNTER — Other Ambulatory Visit: Payer: Self-pay | Admitting: Orthopedic Surgery

## 2017-08-12 DIAGNOSIS — M19011 Primary osteoarthritis, right shoulder: Secondary | ICD-10-CM

## 2017-08-21 ENCOUNTER — Ambulatory Visit
Admission: RE | Admit: 2017-08-21 | Discharge: 2017-08-21 | Disposition: A | Discharge status: Home / Self Care | Payer: PPO | Source: Ambulatory Visit | Attending: Orthopedic Surgery | Admitting: Orthopedic Surgery

## 2017-08-21 DIAGNOSIS — M19011 Primary osteoarthritis, right shoulder: Secondary | ICD-10-CM

## 2017-08-21 DIAGNOSIS — Z471 Aftercare following joint replacement surgery: Secondary | ICD-10-CM | POA: Diagnosis not present

## 2017-08-21 DIAGNOSIS — Z96611 Presence of right artificial shoulder joint: Secondary | ICD-10-CM | POA: Diagnosis not present

## 2017-08-25 DIAGNOSIS — F418 Other specified anxiety disorders: Secondary | ICD-10-CM | POA: Diagnosis not present

## 2017-08-25 DIAGNOSIS — K210 Gastro-esophageal reflux disease with esophagitis: Secondary | ICD-10-CM | POA: Diagnosis not present

## 2017-08-25 DIAGNOSIS — F320 Major depressive disorder, single episode, mild: Secondary | ICD-10-CM | POA: Diagnosis not present

## 2017-08-25 DIAGNOSIS — E119 Type 2 diabetes mellitus without complications: Secondary | ICD-10-CM | POA: Diagnosis not present

## 2017-08-25 DIAGNOSIS — I10 Essential (primary) hypertension: Secondary | ICD-10-CM | POA: Diagnosis not present

## 2017-08-25 DIAGNOSIS — Z6824 Body mass index (BMI) 24.0-24.9, adult: Secondary | ICD-10-CM | POA: Diagnosis not present

## 2017-08-26 NOTE — Pre-Procedure Instructions (Signed)
Jill Fisher  08/27/2017      [facility] [place], [place] [address] [place] [address] Phone: [phone] Fax: [phone]  [facility] [address], [place] [address] Phone: [phone] Fax: [phone]    Your procedure is scheduled on Thurs., September 03, 2017 from 10:15AM-12:30PM  Report to [facility] Admitting Entrance "A" at 8:15AM  Call this number if you have problems the morning of surgery:  [phone]   Remember:  Do not eat or drink after midnight on June 26th    Take these medicines the morning of surgery with A SIP OF WATER: ALPRAZolam Duanne Moron)  Follow your doctors instructions regarding your Aspirin.  If no instructions were given by your doctor, then you will need to call the prescribing office office to get instructions.    As of today, stop taking all Other Aspirin Products, Vitamins, Fish oils, and Herbal medications. Also stop all NSAIDS i.e. Advil, Ibuprofen, Motrin, Aleve, Anaprox, Naproxen, BC, Goody Powders, and all Supplements.  How to Manage Your Diabetes Before and After Surgery  Why is it important to control my blood sugar before and after surgery? . Improving blood sugar levels before and after surgery helps healing and can limit problems. . A way of improving blood sugar control is eating a healthy diet by: o  Eating less sugar and carbohydrates o  Increasing activity/exercise o  Talking with your doctor about reaching your blood sugar goals . High blood sugars (greater than 180 mg/dL) can raise your risk of infections and slow your recovery, so you will need to focus on controlling your diabetes during the weeks before surgery. . Make sure that the doctor who takes care of your diabetes knows about your planned surgery including the date and location.  How do I manage my blood sugar before surgery? . Check your blood sugar at least 4 times a day,  starting 2 days before surgery, to make sure that the level is not too high or low. o Check your blood sugar the morning of your surgery when you wake up and every 2 hours until you get to the Short Stay unit. . If your blood sugar is less than 70 mg/dL, you will need to treat for low blood sugar: o Do not take insulin. o Treat a low blood sugar (less than 70 mg/dL) with  cup of clear juice (cranberry or apple), 4 glucose tablets, OR glucose gel. Recheck blood sugar in 15 minutes after treatment (to make sure it is greater than 70 mg/dL). If your blood sugar is not greater than 70 mg/dL on recheck, call [phone] o  for further instructions. . Report your blood sugar to the short stay nurse when you get to Short Stay.  . If you are admitted to the hospital after surgery: o Your blood sugar will be checked by the staff and you will probably be given insulin after surgery (instead of oral diabetes medicines) to make sure you have good blood sugar levels. o The goal for blood sugar control after surgery is 80-180 mg/dL.  WHAT DO I DO ABOUT MY DIABETES MEDICATION?  Marland Kitchen Do not take Glimepiride (AMARYL) and MetFORMIN (GLUCOPHAGE) the morning of surgery.  . If your CBG is greater than 220 mg/dL, call us at [phone]  Reviewed and Endorsed by [facility] Patient Education Committee, August 2015  Do not wear jewelry, make-up or nail polish.  Do not wear lotions, powders, or perfumes, or deodorant.  Do not shave 48 hours prior to surgery.    Do not bring valuables to the hospital.  [facility] is not responsible for any belongings or valuables.  Contacts, dentures or bridgework may not be worn into surgery.  Leave your suitcase in the car.  After surgery it may be brought to your room.  For patients admitted to the hospital, discharge time will be determined by your treatment team.  Patients discharged the day of surgery will not be allowed to drive home.   Special instructions:   [facility]- Preparing For Surgery  Before surgery, you can play an important role. Because skin is not sterile, your skin needs to be as free of germs as possible. You can reduce the number of germs on your skin by washing with CHG (chlorahexidine gluconate) Soap before surgery.  CHG is an antiseptic cleaner which kills germs and bonds with the skin to continue killing germs even after washing.    Oral Hygiene is also important to reduce your risk of infection.  Remember - BRUSH YOUR TEETH THE MORNING OF SURGERY WITH YOUR REGULAR TOOTHPASTE  Please do not use if you have an allergy to CHG or antibacterial soaps. If your skin becomes reddened/irritated stop using the CHG.  Do not shave (including legs and underarms) for at least 48 hours prior to first CHG shower. It is OK to shave your face.  Please follow these instructions carefully.   1. Shower the NIGHT BEFORE SURGERY and the MORNING OF SURGERY with CHG.   2. If you chose to wash your hair, wash your hair first as usual with your normal shampoo.  3. After you shampoo, rinse your hair and body thoroughly to remove the shampoo.  4. Use CHG as you would any other liquid soap. You can apply CHG directly to the skin and wash gently with a scrungie or a clean washcloth.   5. Apply the CHG Soap to your body ONLY FROM THE NECK DOWN.  Do not use on open wounds or open sores. Avoid contact with your eyes, ears, mouth and genitals (private parts). Wash Face and genitals (private parts)  with your normal soap.  6. Wash thoroughly, paying special attention to the area where your surgery will be performed.  7. Thoroughly rinse your body with warm water from the neck down.  8. DO NOT shower/wash with your normal soap after using and rinsing off the CHG Soap.  9. Pat yourself dry with a CLEAN TOWEL.  10. Wear CLEAN PAJAMAS to bed the night before surgery, wear comfortable clothes the morning of surgery  11. Place CLEAN SHEETS on your bed the night of  your first shower and DO NOT SLEEP WITH PETS.  Day of Surgery:  Do not apply any deodorants/lotions.  Please wear clean clothes to the hospital/surgery center.   Remember to brush your teeth WITH YOUR REGULAR TOOTHPASTE.  Please read over the following fact sheets that you were given. Pain Booklet, Coughing and Deep Breathing and Surgical Site Infection Prevention

## 2017-08-27 ENCOUNTER — Other Ambulatory Visit: Payer: Self-pay

## 2017-08-27 ENCOUNTER — Encounter (HOSPITAL_COMMUNITY)
Admission: RE | Admit: 2017-08-27 | Discharge: 2017-08-27 | Disposition: A | Discharge status: Home / Self Care | Payer: PPO | Source: Ambulatory Visit | Attending: Orthopedic Surgery | Admitting: Orthopedic Surgery

## 2017-08-27 ENCOUNTER — Encounter (HOSPITAL_COMMUNITY): Payer: Self-pay

## 2017-08-27 DIAGNOSIS — Z01812 Encounter for preprocedural laboratory examination: Secondary | ICD-10-CM | POA: Diagnosis not present

## 2017-08-27 DIAGNOSIS — Z01810 Encounter for preprocedural cardiovascular examination: Secondary | ICD-10-CM | POA: Diagnosis not present

## 2017-08-27 HISTORY — DX: Type 2 diabetes mellitus without complications: E11.9

## 2017-08-27 HISTORY — DX: Other specified arthritis, right shoulder: M13.811

## 2017-08-27 LAB — CBC
HCT: 41.4 % (ref 36.0–46.0)
Hemoglobin: 13.4 g/dL (ref 12.0–15.0)
MCH: 27.3 pg (ref 26.0–34.0)
MCHC: 32.4 g/dL (ref 30.0–36.0)
MCV: 84.5 fL (ref 78.0–100.0)
Platelets: 255 10*3/uL (ref 150–400)
RBC: 4.9 MIL/uL (ref 3.87–5.11)
RDW: 13.4 % (ref 11.5–15.5)
WBC: 9.6 10*3/uL (ref 4.0–10.5)

## 2017-08-27 LAB — SURGICAL PCR SCREEN
MRSA, PCR: NEGATIVE
Staphylococcus aureus: NEGATIVE

## 2017-08-27 LAB — BASIC METABOLIC PANEL
Anion gap: 10 (ref 5–15)
BUN: 11 mg/dL (ref 6–20)
CO2: 25 mmol/L (ref 22–32)
Calcium: 9.4 mg/dL (ref 8.9–10.3)
Chloride: 100 mmol/L — ABNORMAL LOW (ref 101–111)
Creatinine, Ser: 0.9 mg/dL (ref 0.44–1.00)
GFR calc Af Amer: >60 mL/min (ref >60)
GFR calc non Af Amer: 58 mL/min — ABNORMAL LOW (ref >60)
Glucose, Bld: 68 mg/dL (ref 65–99)
Potassium: 3.9 mmol/L (ref 3.5–5.1)
Sodium: 135 mmol/L (ref 135–145)

## 2017-08-27 LAB — HEMOGLOBIN A1C
Hgb A1c MFr Bld: 6.1 % — ABNORMAL HIGH (ref 4.8–5.6)
Mean Plasma Glucose: 128.37 mg/dL

## 2017-08-27 LAB — GLUCOSE, CAPILLARY: Glucose-Capillary: 103 mg/dL — ABNORMAL HIGH (ref 65–99)

## 2017-08-27 NOTE — Progress Notes (Signed)
PCP - Dr. Francis Dowse- [facility]  Cardiologist - Denies  Chest x-ray - Denies  EKG - 08/27/17  Stress Test - >5 yrs ago- Neg  ECHO - >5 yrs ago- Neg  Cardiac Cath - >5 yrs ago- Neg  Sleep Study - Denies CPAP - None  LABS- 08/27/17: CBC, BMP  ASA- LD-6/20  HA1C- 08/27/17 Fasting Blood Sugar - 100-112, Today 103 Checks Blood Sugar ___1__ time a day  Anesthesia- No  Pt denies having chest pain, sob, or fever at this time. All instructions explained to the pt, with a verbal understanding of the material. Pt agrees to go over the instructions while at home for a better understanding. The opportunity to ask questions was provided.

## 2017-09-02 MED ORDER — CEFAZOLIN SODIUM-DEXTROSE 2-4 GM/100ML-% IV SOLN
2.0000 g | INTRAVENOUS | Status: AC
  Administered 2017-09-03: 2 g via INTRAVENOUS
  Filled 2017-09-02: qty 100

## 2017-09-02 MED ORDER — TRANEXAMIC ACID 1000 MG/10ML IV SOLN
1000.0000 mg | INTRAVENOUS | Status: AC
  Administered 2017-09-03: 1000 mg via INTRAVENOUS
  Filled 2017-09-02: qty 1100

## 2017-09-03 ENCOUNTER — Encounter (HOSPITAL_COMMUNITY)
Admission: RE | Disposition: A | Discharge status: Home / Self Care | Payer: Self-pay | Source: Ambulatory Visit | Attending: Orthopedic Surgery

## 2017-09-03 ENCOUNTER — Encounter (HOSPITAL_COMMUNITY): Payer: Self-pay | Admitting: Surgery

## 2017-09-03 ENCOUNTER — Inpatient Hospital Stay (HOSPITAL_COMMUNITY)
Admission: RE | Admit: 2017-09-03 | Discharge: 2017-09-04 | DRG: 483 | Disposition: A | Discharge status: Home / Self Care | Payer: PPO | Source: Ambulatory Visit | Attending: Orthopedic Surgery | Admitting: Orthopedic Surgery

## 2017-09-03 ENCOUNTER — Inpatient Hospital Stay (HOSPITAL_COMMUNITY): Payer: PPO | Admitting: Anesthesiology

## 2017-09-03 ENCOUNTER — Other Ambulatory Visit: Payer: Self-pay

## 2017-09-03 DIAGNOSIS — K219 Gastro-esophageal reflux disease without esophagitis: Secondary | ICD-10-CM | POA: Diagnosis not present

## 2017-09-03 DIAGNOSIS — E1151 Type 2 diabetes mellitus with diabetic peripheral angiopathy without gangrene: Secondary | ICD-10-CM | POA: Diagnosis not present

## 2017-09-03 DIAGNOSIS — Z7982 Long term (current) use of aspirin: Secondary | ICD-10-CM

## 2017-09-03 DIAGNOSIS — I10 Essential (primary) hypertension: Secondary | ICD-10-CM | POA: Diagnosis present

## 2017-09-03 DIAGNOSIS — Z961 Presence of intraocular lens: Secondary | ICD-10-CM | POA: Diagnosis not present

## 2017-09-03 DIAGNOSIS — Z9841 Cataract extraction status, right eye: Secondary | ICD-10-CM

## 2017-09-03 DIAGNOSIS — Z9049 Acquired absence of other specified parts of digestive tract: Secondary | ICD-10-CM

## 2017-09-03 DIAGNOSIS — I119 Hypertensive heart disease without heart failure: Secondary | ICD-10-CM | POA: Diagnosis not present

## 2017-09-03 DIAGNOSIS — Z79899 Other long term (current) drug therapy: Secondary | ICD-10-CM

## 2017-09-03 DIAGNOSIS — M19011 Primary osteoarthritis, right shoulder: Principal | ICD-10-CM | POA: Diagnosis present

## 2017-09-03 DIAGNOSIS — Z95828 Presence of other vascular implants and grafts: Secondary | ICD-10-CM

## 2017-09-03 DIAGNOSIS — Z8249 Family history of ischemic heart disease and other diseases of the circulatory system: Secondary | ICD-10-CM | POA: Diagnosis not present

## 2017-09-03 DIAGNOSIS — Z833 Family history of diabetes mellitus: Secondary | ICD-10-CM

## 2017-09-03 DIAGNOSIS — Z7984 Long term (current) use of oral hypoglycemic drugs: Secondary | ICD-10-CM | POA: Diagnosis not present

## 2017-09-03 DIAGNOSIS — Z9842 Cataract extraction status, left eye: Secondary | ICD-10-CM | POA: Diagnosis not present

## 2017-09-03 DIAGNOSIS — G8918 Other acute postprocedural pain: Secondary | ICD-10-CM | POA: Diagnosis not present

## 2017-09-03 DIAGNOSIS — I2510 Atherosclerotic heart disease of native coronary artery without angina pectoris: Secondary | ICD-10-CM | POA: Diagnosis present

## 2017-09-03 DIAGNOSIS — E785 Hyperlipidemia, unspecified: Secondary | ICD-10-CM | POA: Diagnosis present

## 2017-09-03 DIAGNOSIS — Z96619 Presence of unspecified artificial shoulder joint: Secondary | ICD-10-CM

## 2017-09-03 DIAGNOSIS — Z96611 Presence of right artificial shoulder joint: Secondary | ICD-10-CM

## 2017-09-03 HISTORY — PX: REVERSE SHOULDER ARTHROPLASTY: SHX5054

## 2017-09-03 LAB — GLUCOSE, CAPILLARY
Glucose-Capillary: 130 mg/dL — ABNORMAL HIGH (ref 70–99)
Glucose-Capillary: 130 mg/dL — ABNORMAL HIGH (ref 70–99)
Glucose-Capillary: 184 mg/dL — ABNORMAL HIGH (ref 70–99)
Glucose-Capillary: 158 mg/dL — ABNORMAL HIGH (ref 70–99)

## 2017-09-03 SURGERY — ARTHROPLASTY, SHOULDER, TOTAL, REVERSE
Anesthesia: Regional | Site: Shoulder | Laterality: Right

## 2017-09-03 MED ORDER — PROPOFOL 10 MG/ML IV BOLUS
INTRAVENOUS | Status: DC | PRN
  Administered 2017-09-03: 70 mg via INTRAVENOUS

## 2017-09-03 MED ORDER — INSULIN ASPART 100 UNIT/ML ~~LOC~~ SOLN
0.0000 [IU] | Freq: Three times a day (TID) | SUBCUTANEOUS | Status: DC
  Administered 2017-09-03: 3 [IU] via SUBCUTANEOUS

## 2017-09-03 MED ORDER — MENTHOL 3 MG MT LOZG: 1.0000 | LOZENGE | OROMUCOSAL | Status: DC | PRN

## 2017-09-03 MED ORDER — FENTANYL CITRATE (PF) 250 MCG/5ML IJ SOLN
INTRAMUSCULAR | Status: AC
  Filled 2017-09-03: qty 5

## 2017-09-03 MED ORDER — OXYCODONE HCL 5 MG PO TABS: 5.0000 mg | ORAL_TABLET | Freq: Once | ORAL | Status: DC | PRN

## 2017-09-03 MED ORDER — KETOROLAC TROMETHAMINE 15 MG/ML IJ SOLN
7.5000 mg | Freq: Four times a day (QID) | INTRAMUSCULAR | Status: AC
  Administered 2017-09-03 – 2017-09-04 (×4): 7.5 mg via INTRAVENOUS
  Filled 2017-09-03 (×3): qty 1

## 2017-09-03 MED ORDER — LACTATED RINGERS IV SOLN
INTRAVENOUS | Status: DC
  Administered 2017-09-03: 08:00:00 via INTRAVENOUS

## 2017-09-03 MED ORDER — APOAEQUORIN 10 MG PO CAPS: 10.0000 mg | ORAL_CAPSULE | Freq: Every day | ORAL | Status: DC

## 2017-09-03 MED ORDER — PHENYLEPHRINE HCL 10 MG/ML IJ SOLN
INTRAVENOUS | Status: DC | PRN
  Administered 2017-09-03: 25 ug/min via INTRAVENOUS

## 2017-09-03 MED ORDER — 0.9 % SODIUM CHLORIDE (POUR BTL) OPTIME
TOPICAL | Status: DC | PRN
  Administered 2017-09-03: 1000 mL

## 2017-09-03 MED ORDER — CHLORHEXIDINE GLUCONATE 4 % EX LIQD: 60.0000 mL | Freq: Once | CUTANEOUS | Status: DC

## 2017-09-03 MED ORDER — MAGNESIUM CITRATE PO SOLN: 1.0000 | Freq: Once | ORAL | Status: DC | PRN

## 2017-09-03 MED ORDER — OXYCODONE HCL 5 MG PO TABS: 10.0000 mg | ORAL_TABLET | ORAL | Status: DC | PRN

## 2017-09-03 MED ORDER — MIDAZOLAM HCL 2 MG/2ML IJ SOLN
INTRAMUSCULAR | Status: AC
  Filled 2017-09-03: qty 2

## 2017-09-03 MED ORDER — SUGAMMADEX SODIUM 200 MG/2ML IV SOLN
INTRAVENOUS | Status: DC | PRN
  Administered 2017-09-03: 200 mg via INTRAVENOUS

## 2017-09-03 MED ORDER — FENTANYL CITRATE (PF) 100 MCG/2ML IJ SOLN: 25.0000 ug | INTRAMUSCULAR | Status: DC | PRN

## 2017-09-03 MED ORDER — FENTANYL CITRATE (PF) 100 MCG/2ML IJ SOLN
INTRAMUSCULAR | Status: DC | PRN
  Administered 2017-09-03: 100 ug via INTRAVENOUS

## 2017-09-03 MED ORDER — SUGAMMADEX SODIUM 200 MG/2ML IV SOLN
INTRAVENOUS | Status: AC
  Filled 2017-09-03: qty 2

## 2017-09-03 MED ORDER — DEXAMETHASONE SODIUM PHOSPHATE 10 MG/ML IJ SOLN
INTRAMUSCULAR | Status: AC
  Filled 2017-09-03: qty 1

## 2017-09-03 MED ORDER — PHENOL 1.4 % MT LIQD: 1.0000 | OROMUCOSAL | Status: DC | PRN

## 2017-09-03 MED ORDER — PROPOFOL 10 MG/ML IV BOLUS
INTRAVENOUS | Status: AC
  Filled 2017-09-03: qty 20

## 2017-09-03 MED ORDER — POLYETHYLENE GLYCOL 3350 17 G PO PACK: 17.0000 g | PACK | Freq: Every day | ORAL | Status: DC | PRN

## 2017-09-03 MED ORDER — DIPHENHYDRAMINE HCL 12.5 MG/5ML PO ELIX: 12.5000 mg | ORAL_SOLUTION | ORAL | Status: DC | PRN

## 2017-09-03 MED ORDER — ALUM & MAG HYDROXIDE-SIMETH 200-200-20 MG/5ML PO SUSP: 30.0000 mL | ORAL | Status: DC | PRN

## 2017-09-03 MED ORDER — FENTANYL CITRATE (PF) 100 MCG/2ML IJ SOLN
INTRAMUSCULAR | Status: AC
  Administered 2017-09-03: 50 ug via INTRAVENOUS
  Filled 2017-09-03: qty 2

## 2017-09-03 MED ORDER — CHLORTHALIDONE 25 MG PO TABS
25.0000 mg | ORAL_TABLET | Freq: Every day | ORAL | Status: DC
  Administered 2017-09-04: 25 mg via ORAL
  Filled 2017-09-03 (×2): qty 1

## 2017-09-03 MED ORDER — ROCURONIUM BROMIDE 100 MG/10ML IV SOLN
INTRAVENOUS | Status: DC | PRN
  Administered 2017-09-03: 30 mg via INTRAVENOUS

## 2017-09-03 MED ORDER — ROCURONIUM BROMIDE 50 MG/5ML IV SOLN
INTRAVENOUS | Status: AC
  Filled 2017-09-03: qty 1

## 2017-09-03 MED ORDER — ACETAMINOPHEN 325 MG PO TABS: 325.0000 mg | ORAL_TABLET | Freq: Four times a day (QID) | ORAL | Status: DC | PRN

## 2017-09-03 MED ORDER — LISINOPRIL 2.5 MG PO TABS
2.5000 mg | ORAL_TABLET | Freq: Every day | ORAL | Status: DC
  Administered 2017-09-03 – 2017-09-04 (×2): 2.5 mg via ORAL
  Filled 2017-09-03 (×2): qty 1

## 2017-09-03 MED ORDER — LACTATED RINGERS IV SOLN
INTRAVENOUS | Status: DC
  Administered 2017-09-03: 14:00:00 via INTRAVENOUS

## 2017-09-03 MED ORDER — LIDOCAINE 2% (20 MG/ML) 5 ML SYRINGE
INTRAMUSCULAR | Status: AC
  Filled 2017-09-03: qty 5

## 2017-09-03 MED ORDER — DOCUSATE SODIUM 100 MG PO CAPS
100.0000 mg | ORAL_CAPSULE | Freq: Two times a day (BID) | ORAL | Status: DC
  Administered 2017-09-03 – 2017-09-04 (×2): 100 mg via ORAL
  Filled 2017-09-03 (×2): qty 1

## 2017-09-03 MED ORDER — ONDANSETRON HCL 4 MG PO TABS: 4.0000 mg | ORAL_TABLET | Freq: Four times a day (QID) | ORAL | Status: DC | PRN

## 2017-09-03 MED ORDER — BISACODYL 5 MG PO TBEC
5.0000 mg | DELAYED_RELEASE_TABLET | Freq: Every day | ORAL | Status: DC | PRN
Start: 2017-09-03 — End: 2017-09-04

## 2017-09-03 MED ORDER — ATENOLOL-CHLORTHALIDONE 50-25 MG PO TABS: 1.0000 | ORAL_TABLET | Freq: Every day | ORAL | Status: DC

## 2017-09-03 MED ORDER — ALPRAZOLAM 0.25 MG PO TABS
0.2500 mg | ORAL_TABLET | Freq: Every day | ORAL | Status: DC
  Administered 2017-09-03: 0.25 mg via ORAL
  Filled 2017-09-03: qty 1

## 2017-09-03 MED ORDER — ONDANSETRON HCL 4 MG/2ML IJ SOLN
INTRAMUSCULAR | Status: DC | PRN
  Administered 2017-09-03: 4 mg via INTRAVENOUS

## 2017-09-03 MED ORDER — ONDANSETRON HCL 4 MG/2ML IJ SOLN
INTRAMUSCULAR | Status: AC
  Filled 2017-09-03: qty 2

## 2017-09-03 MED ORDER — KETOROLAC TROMETHAMINE 15 MG/ML IJ SOLN
INTRAMUSCULAR | Status: AC
  Filled 2017-09-03: qty 1

## 2017-09-03 MED ORDER — OXYCODONE HCL 5 MG/5ML PO SOLN: 5.0000 mg | Freq: Once | ORAL | Status: DC | PRN

## 2017-09-03 MED ORDER — PROMETHAZINE HCL 25 MG/ML IJ SOLN
INTRAMUSCULAR | Status: AC
  Filled 2017-09-03: qty 1

## 2017-09-03 MED ORDER — ONDANSETRON HCL 4 MG/2ML IJ SOLN: 4.0000 mg | Freq: Four times a day (QID) | INTRAMUSCULAR | Status: DC | PRN

## 2017-09-03 MED ORDER — DEXAMETHASONE SODIUM PHOSPHATE 10 MG/ML IJ SOLN
INTRAMUSCULAR | Status: DC | PRN
  Administered 2017-09-03: 5 mg via INTRAVENOUS

## 2017-09-03 MED ORDER — METOCLOPRAMIDE HCL 5 MG PO TABS: 5.0000 mg | ORAL_TABLET | Freq: Three times a day (TID) | ORAL | Status: DC | PRN

## 2017-09-03 MED ORDER — ATENOLOL 50 MG PO TABS
50.0000 mg | ORAL_TABLET | Freq: Every day | ORAL | Status: DC
  Administered 2017-09-03 – 2017-09-04 (×2): 50 mg via ORAL
  Filled 2017-09-03 (×2): qty 1

## 2017-09-03 MED ORDER — FENTANYL CITRATE (PF) 100 MCG/2ML IJ SOLN
50.0000 ug | Freq: Once | INTRAMUSCULAR | Status: AC
  Administered 2017-09-03: 50 ug via INTRAVENOUS

## 2017-09-03 MED ORDER — LIDOCAINE HCL (CARDIAC) PF 100 MG/5ML IV SOSY
PREFILLED_SYRINGE | INTRAVENOUS | Status: DC | PRN
  Administered 2017-09-03: 40 mg via INTRAVENOUS

## 2017-09-03 MED ORDER — OXYCODONE HCL 5 MG PO TABS: 5.0000 mg | ORAL_TABLET | ORAL | Status: DC | PRN

## 2017-09-03 MED ORDER — ASPIRIN EC 81 MG PO TBEC
81.0000 mg | DELAYED_RELEASE_TABLET | Freq: Every day | ORAL | Status: DC
  Administered 2017-09-04: 81 mg via ORAL
  Filled 2017-09-03: qty 1

## 2017-09-03 MED ORDER — HYDROMORPHONE HCL 2 MG/ML IJ SOLN: 0.5000 mg | INTRAMUSCULAR | Status: DC | PRN

## 2017-09-03 MED ORDER — PROMETHAZINE HCL 25 MG/ML IJ SOLN
6.2500 mg | INTRAMUSCULAR | Status: DC | PRN
  Administered 2017-09-03: 6.25 mg via INTRAVENOUS

## 2017-09-03 MED ORDER — METOCLOPRAMIDE HCL 5 MG/ML IJ SOLN: 5.0000 mg | Freq: Three times a day (TID) | INTRAMUSCULAR | Status: DC | PRN

## 2017-09-03 SURGICAL SUPPLY — 62 items
ADH SKN CLS APL DERMABOND .7 (GAUZE/BANDAGES/DRESSINGS) ×1
ADH SKN CLS LQ APL DERMABOND (GAUZE/BANDAGES/DRESSINGS) ×1
AID PSTN UNV HD RSTRNT DISP (MISCELLANEOUS) ×1
BASEPLATE GLENOID SHLDR SM (Shoulder) ×1 IMPLANT
BLADE SAW SGTL 83.5X18.5 (BLADE) ×2 IMPLANT
BSPLAT GLND SM PRFT SHLDR CA (Shoulder) ×1 IMPLANT
CALIBRATOR GLENOID VIP 5-D (SYSTAGENIX WOUND MANAGEMENT) ×1 IMPLANT
COVER SURGICAL LIGHT HANDLE (MISCELLANEOUS) ×2 IMPLANT
CUP SUT UNIV REVERS 36 NEUTRAL (Cup) ×1 IMPLANT
DERMABOND ADHESIVE PROPEN (GAUZE/BANDAGES/DRESSINGS) ×1
DERMABOND ADVANCED (GAUZE/BANDAGES/DRESSINGS) ×1
DERMABOND ADVANCED .7 DNX12 (GAUZE/BANDAGES/DRESSINGS) ×1 IMPLANT
DERMABOND ADVANCED .7 DNX6 (GAUZE/BANDAGES/DRESSINGS) IMPLANT
DRAPE ORTHO SPLIT 77X108 STRL (DRAPES) ×4
DRAPE SURG 17X11 SM STRL (DRAPES) ×2 IMPLANT
DRAPE SURG ORHT 6 SPLT 77X108 (DRAPES) ×2 IMPLANT
DRAPE U-SHAPE 47X51 STRL (DRAPES) ×2 IMPLANT
DRSG AQUACEL AG ADV 3.5X10 (GAUZE/BANDAGES/DRESSINGS) ×2 IMPLANT
DURAPREP 26ML APPLICATOR (WOUND CARE) ×2 IMPLANT
ELECT BLADE 4.0 EZ CLEAN MEGAD (MISCELLANEOUS) ×2
ELECT CAUTERY BLADE 6.4 (BLADE) ×2 IMPLANT
ELECT REM PT RETURN 9FT ADLT (ELECTROSURGICAL) ×2
ELECTRODE BLDE 4.0 EZ CLN MEGD (MISCELLANEOUS) ×1 IMPLANT
ELECTRODE REM PT RTRN 9FT ADLT (ELECTROSURGICAL) ×1 IMPLANT
FACESHIELD WRAPAROUND (MASK) ×6 IMPLANT
FACESHIELD WRAPAROUND OR TEAM (MASK) ×3 IMPLANT
GLENOSPHERE LATERAL 36MM+4 (Shoulder) ×1 IMPLANT
GLOVE BIO SURGEON STRL SZ7.5 (GLOVE) ×2 IMPLANT
GLOVE BIO SURGEON STRL SZ8 (GLOVE) ×2 IMPLANT
GLOVE EUDERMIC 7 POWDERFREE (GLOVE) ×2 IMPLANT
GLOVE SS BIOGEL STRL SZ 7.5 (GLOVE) ×1 IMPLANT
GLOVE SUPERSENSE BIOGEL SZ 7.5 (GLOVE) ×1
GOWN STRL REUS W/ TWL LRG LVL3 (GOWN DISPOSABLE) ×1 IMPLANT
GOWN STRL REUS W/ TWL XL LVL3 (GOWN DISPOSABLE) ×2 IMPLANT
GOWN STRL REUS W/TWL LRG LVL3 (GOWN DISPOSABLE) ×2
GOWN STRL REUS W/TWL XL LVL3 (GOWN DISPOSABLE) ×4
KIT BASIN OR (CUSTOM PROCEDURE TRAY) ×2 IMPLANT
KIT TURNOVER KIT B (KITS) ×2 IMPLANT
LINER HUMERAL 36 +3MM SM (Shoulder) ×1 IMPLANT
MANIFOLD NEPTUNE II (INSTRUMENTS) ×2 IMPLANT
NDL TAPERED W/ NITINOL LOOP (MISCELLANEOUS) ×1 IMPLANT
NEEDLE TAPERED W/ NITINOL LOOP (MISCELLANEOUS) ×2 IMPLANT
NS IRRIG 1000ML POUR BTL (IV SOLUTION) ×2 IMPLANT
PACK SHOULDER (CUSTOM PROCEDURE TRAY) ×2 IMPLANT
PAD ARMBOARD 7.5X6 YLW CONV (MISCELLANEOUS) ×4 IMPLANT
RESTRAINT HEAD UNIVERSAL NS (MISCELLANEOUS) ×2 IMPLANT
SCREW CENTRAL NONLOCK 6.5X20MM (Shoulder) ×1 IMPLANT
SCREW LOCK PERIPHERAL 30MM (Shoulder) ×2 IMPLANT
SET PIN UNIVERSAL REVERSE (SET/KITS/TRAYS/PACK) ×1 IMPLANT
SLING ARM FOAM STRAP LRG (SOFTGOODS) ×1 IMPLANT
SPACER SHLD UNI REV 36 +6 (Shoulder) ×1 IMPLANT
SPONGE LAP 18X18 X RAY DECT (DISPOSABLE) ×2 IMPLANT
SPONGE LAP 4X18 RFD (DISPOSABLE) ×2 IMPLANT
STEM HUMERAL UNI REVERS SZ6 (Stem) ×1 IMPLANT
SUT MNCRL AB 3-0 PS2 18 (SUTURE) ×2 IMPLANT
SUT MON AB 2-0 CT1 27 (SUTURE) ×2 IMPLANT
SUT VIC AB 1 CT1 27 (SUTURE) ×2
SUT VIC AB 1 CT1 27XBRD ANBCTR (SUTURE) ×1 IMPLANT
SUTURE TAPE 1.3 40 TPR END (SUTURE) ×1 IMPLANT
SUTURETAPE 1.3 40 TPR END (SUTURE) ×2
TOWEL OR 17X26 10 PK STRL BLUE (TOWEL DISPOSABLE) ×2 IMPLANT
WATER STERILE IRR 1000ML POUR (IV SOLUTION) ×2 IMPLANT

## 2017-09-03 NOTE — Anesthesia Procedure Notes (Signed)
Procedure Name: Intubation Date/Time: 09/03/2017 10:41 AM Performed by: Kyung Rudd, CRNA Pre-anesthesia Checklist: Patient identified, Emergency Drugs available, Suction available and Patient being monitored Patient Re-evaluated:Patient Re-evaluated prior to induction Oxygen Delivery Method: Circle system utilized Preoxygenation: Pre-oxygenation with 100% oxygen Induction Type: IV induction and Cricoid Pressure applied Ventilation: Mask ventilation without difficulty Laryngoscope Size: Mac and 3 Grade View: Grade I Tube type: Oral Tube size: 7.0 mm Number of attempts: 1 Airway Equipment and Method: Stylet Placement Confirmation: ETT inserted through vocal cords under direct vision,  positive ETCO2 and breath sounds checked- equal and bilateral Secured at: 21 cm Tube secured with: Tape Dental Injury: Teeth and Oropharynx as per pre-operative assessment

## 2017-09-03 NOTE — Op Note (Signed)
09/03/2017  12:16 PM  PATIENT:   Jill Fisher  83 y.o. female  PRE-OPERATIVE DIAGNOSIS:  right shoulder osteoarthritis  POST-OPERATIVE DIAGNOSIS:  same  PROCEDURE:  R RSA #6 stem, +6 spacer, +3 poly, 36/+4 glenosphere, small baseplate  SURGEON:  Charrise Lardner, Metta Clines M.D.  ASSISTANTS: Shuford pac   ANESTHESIA:   GET + ISB  EBL: 150  SPECIMEN:  none  Drains: none   PATIENT DISPOSITION:  PACU - hemodynamically stable.    PLAN OF CARE: Admit for overnight observation   Brief history:  Jill Fisher presents with many years of progressive increasing right shoulder pain with plain radiographs confirming end-stage osteoarthritis with severe bony erosion.  Due to her increasing pain and functional limitation she is brought to the operating this time for planned right shoulder reverse arthroplasty  Preoperatively and counseled Jill Fisher regarding treatment options as well as the potential risks versus benefits thereof.  Possible surgical complications are reviewed including potential for bleeding, infection, neurovascular injury, persistent pain, failure of the implant, anesthetic location, and possible need for additional surgery.  She understands and accepts and agrees with the plan.  Procedure in detail:  After undergoing routine preop evaluation patient received prophylactic antibiotics and interscalene block with Exparel was placed in the holding area by the anesthesia department.  Brought to the operating placed supine on the operative underwent smooth induction of a general endotracheal anesthesia.  The right shoulder girdle region was sterilely prepped and draped in standard fashion after the patient had been carefully placed in the beachchair position and appropriate padding protected.  Timeout was called.  An anterior deltopectoral approach to the right shoulder was made through the 10 cm incision.  Dissection carried deeply the deltopectoral interval was then developed a proximal  distal with cautery was used for hemostasis.  Upper centimeter the pectoralis major was tenotomized to improve exposure in the long head biceps tendon was then tenodesed at the upper border of the pectoralis major and the tenotomized and proximal portion was excised.  The conjoined tendon was retracted medially the subcu retractors.  The subscapularis was then separated from the lesser tuberosity using a peel technique and the rotator interval was then split towards the base of the coracoid allowing the subscap to be reflected anteriorly and of note there was severe calcifications noted of the synovial lining the joint to perform to a extensive synovectomy removing the calcific deposits in the anterior capsule.  Capsular attachments were then divided in the humeral neck allowing deliver the humeral head through the wound.  We performed a humeral head resection with the oscillating saw at the native retroversion of approximate 30 degrees.  The remaining osteophytes were then removed with rondure.  A metal cap was then placed with cut surface the proximal humerus this point then exposed the glenoid with combination of Fukuda, pitchfork, inspecting retractors.  There was severe synovitis and marked over gross of the synovial tissues and performed extensive synovectomy and a complete labral excision.  We then used the glenoid to guide to position our guidepin into the center of the glenoid as we had previously planned using a preoperative CT scan.  Once the guidepin was then placed we performed preparation of the glenoid with the central followed by the peripheral reamer sequentially to we had a stable subchondral bony bed appropriate for baseplate.  Guidepin was removed the baseplate plate was impacted in a central lag screw was placed followed by the inferior and superior locking screws all of  which obtained good bony purchase and fixation.  The 36+4 glenosphere was then placed onto our baseplate impacted and good  stability was achieved.  We then returned our attention to the proximal humerus where the canal was then prepared hand reaming up to 6 and broaching up to size 6 with excellent fit fixation.  We then performed metaphyseal reaming in the neutral position.  Trial implant was then placed in the showed good soft tissue balance good stability.  At this point on the back table we assembled the final implant and this was then impacted obtaining excellent fit and fixation.  We then performed additional trial reductions and ultimately felt that a 6 spacer with a +3 poly-would be appropriate with excellent soft tissue balance.  The final spacer was inserted followed by the final polyethylene insert and final reduction showed excellent soft tissue balance good shoulder motion good stability.  The wound was then copes irrigated.  Hemostasis was obtained.  The subscapularis was then repaired back to the lesser tuberosity region using the eyelets on the collar of our implant for a solid repair and the arm then the easily achieved 45 degrees of X rotation without excessive tension on the subscap repair.  Should mention also repaired the rotator interval with a pair of suture tape sutures.  Wound again was then irrigated the deltopectoral interval was then reapproximated the series of figure-of-eight #1 Vicryl sutures.  2-0 Vicryl used with subcu layer and intracuticular 3 Monocryl for the skin followed by Dermabond and Aquasol dressing right and was placed in a sling the patient was awakened extubated and taken to recovery room in stable condition  Jenetta Loges, PA-C was used as an Environmental consultant throughout this case essential for help with positioning the patient, position extremity, tissue manipulation, implantation the prosthesis, wound closure, and intraoperative decision-making.  Kipp Laurence # [phone]

## 2017-09-03 NOTE — Transfer of Care (Signed)
Immediate Anesthesia Transfer of Care Note  Patient: Jill Fisher  Procedure(s) Performed: REVERSE RIGHT SHOULDER ARTHROPLASTY (Right Shoulder)  Patient Location: PACU  Anesthesia Type:GA combined with regional for post-op pain  Level of Consciousness: awake, alert  and oriented  Airway & Oxygen Therapy: Patient Spontanous Breathing and Patient connected to nasal cannula oxygen  Post-op Assessment: Report given to RN, Post -op Vital signs reviewed and stable and Patient moving all extremities  Post vital signs: Reviewed and stable  Last Vitals:  Vitals Value Taken Time  BP 152/76 09/03/2017 12:38 PM  Temp    Pulse 94 09/03/2017 12:42 PM  Resp 20 09/03/2017 12:42 PM  SpO2 95 % 09/03/2017 12:42 PM  Vitals shown include unvalidated device data.  Last Pain:  Vitals:   09/03/17 0851  TempSrc:   PainSc: 0-No pain      Patients Stated Pain Goal: 3 (87/86/76 7209)  Complications: No apparent anesthesia complications

## 2017-09-03 NOTE — Anesthesia Preprocedure Evaluation (Addendum)
Anesthesia Evaluation  Patient identified by MRN, date of birth, ID band Patient awake    Reviewed: Allergy & Precautions, NPO status , Patient's Chart, lab work & pertinent test results, reviewed documented beta blocker date and time   History of Anesthesia Complications Negative for: history of anesthetic complications  Airway Mallampati: II  TM Distance: >3 FB Neck ROM: Full    Dental  (+) Partial Upper, Dental Advisory Given   Pulmonary neg pulmonary ROS,    breath sounds clear to auscultation       Cardiovascular hypertension, Pt. on medications and Pt. on home beta blockers + CAD and + Peripheral Vascular Disease   Rhythm:Regular     Neuro/Psych negative neurological ROS  negative psych ROS   GI/Hepatic GERD  ,  Endo/Other  diabetes  Renal/GU      Musculoskeletal  (+) Arthritis ,   Abdominal   Peds  Hematology negative hematology ROS (+)   Anesthesia Other Findings   Reproductive/Obstetrics                            Anesthesia Physical Anesthesia Plan  ASA: III  Anesthesia Plan: General and Regional   Post-op Pain Management: GA combined w/ Regional for post-op pain   Induction: Intravenous  PONV Risk Score and Plan: 3 and Ondansetron and Dexamethasone  Airway Management Planned: Oral ETT  Additional Equipment: None  Intra-op Plan:   Post-operative Plan: Extubation in OR  Informed Consent: I have reviewed the patients History and Physical, chart, labs and discussed the procedure including the risks, benefits and alternatives for the proposed anesthesia with the patient or authorized representative who has indicated his/her understanding and acceptance.   Dental advisory given  Plan Discussed with: CRNA and Anesthesiologist  Anesthesia Plan Comments:        Anesthesia Quick Evaluation

## 2017-09-03 NOTE — H&P (Signed)
Jill Fisher    Chief Complaint: right shoulder osteoarthritis HPI: The patient is a 83 y.o. female with end stage right shoulder OA  Past Medical History:  Diagnosis Date  . Allergic arthritis of shoulder region, right   . Cerebrovascular disease    followed at [facility]  . Coronary artery disease    LVEF  65%  . Diabetes mellitus without complication ([place])    Type II  . GERD (gastroesophageal reflux disease)   . Hyperlipidemia   . Hypertension     Past Surgical History:  Procedure Laterality Date  . APPENDECTOMY    . CARDIAC CATHETERIZATION    . CAROTID ENDARTERECTOMY     left  . CAROTID STENT INSERTION Left   . CATARACT EXTRACTION W/PHACO Right 11/30/2012   Procedure: CATARACT EXTRACTION PHACO AND INTRAOCULAR LENS PLACEMENT (IOC);  Surgeon: Elta Guadeloupe [name], MD;  Location: [facility];  Service: Ophthalmology;  Laterality: Right;  CDE:12.71  . CATARACT EXTRACTION W/PHACO Left 12/21/2012   Procedure: CATARACT EXTRACTION PHACO AND INTRAOCULAR LENS PLACEMENT (IOC);  Surgeon: Elta Guadeloupe [name], MD;  Location: [facility];  Service: Ophthalmology;  Laterality: Left;  CDE:12.65  . CHOLECYSTECTOMY    . COLONOSCOPY N/A 06/29/2012   Procedure: COLONOSCOPY;  Surgeon: Rogene Houston, MD;  Location: [facility];  Service: Endoscopy;  Laterality: N/A;  1200-moved to 1215 [name] to notify pt  . YAG LASER APPLICATION Right 06/05/9240   Procedure: YAG LASER APPLICATION;  Surgeon: Rutherford Guys, MD;  Location: [facility];  Service: Ophthalmology;  Laterality: Right;  . YAG LASER APPLICATION Left 08/15/3417   Procedure: YAG LASER APPLICATION;  Surgeon: Rutherford Guys, MD;  Location: [facility];  Service: Ophthalmology;  Laterality: Left;    Family History  Problem Relation Age of Onset  . Heart disease Mother   . Cerebrovascular Disease Father   . Cancer Sister   . Cerebrovascular Disease Brother   . Diabetes Brother   . Heart attack Brother   . Liver disease Brother     Social History:  reports that she has never  smoked. She has never used smokeless tobacco. She reports that she does not drink alcohol or use drugs.   Medications Prior to Admission  Medication Sig Dispense Refill  . ALPRAZolam (XANAX) 0.25 MG tablet Take 0.25 mg by mouth at bedtime.  0  . Apoaequorin (PREVAGEN) 10 MG CAPS Take 10 mg by mouth daily.     Marland Kitchen aspirin (ASPIRIN EC) 81 MG EC tablet Take 81 mg by mouth daily. Swallow whole.    Marland Kitchen atenolol-chlorthalidone (TENORETIC) 50-25 MG per tablet Take 1 tablet by mouth daily.    Marland Kitchen glimepiride (AMARYL) 1 MG tablet Take 1 mg by mouth daily with breakfast.     . lisinopril (PRINIVIL,ZESTRIL) 2.5 MG tablet Take 2.5 mg by mouth daily.     . metFORMIN (GLUCOPHAGE) 1000 MG tablet Take 1,000 mg by mouth 2 (two) times daily with a meal.     . Multiple Vitamins-Minerals (PRESERVISION AREDS 2) CAPS Take 1 capsule by mouth daily.    . Omega-3 Fatty Acids (FISH OIL) 1000 MG CAPS Take 1,000 mg by mouth 2 (two) times daily.     Marland Kitchen triamcinolone cream (KENALOG) 0.1 % Apply 1 application topically 2 (two) times daily as needed. For itchy legs.  0     Physical Exam: right shoulder with painful and restricted motion as noted at recent office visits  Vitals  Temp:  [98 F (36.7 C)] 98 F (36.7 C) (  06/27 0819) Pulse Rate:  [83] 83 (06/27 0819) Resp:  [17] 17 (06/27 0819) BP: (150)/(70) 150/70 (06/27 0819) SpO2:  [98 %] 98 % (06/27 0819)  Assessment/Plan  Impression: right shoulder osteoarthritis  Plan of Action: Procedure(s): REVERSE RIGHT SHOULDER ARTHROPLASTY  Demetri Kerman M Leonidas Boateng 09/03/2017, 9:52 AM Contact # [phone]

## 2017-09-03 NOTE — Discharge Instructions (Signed)
° °Kevin M. Supple, M.D., F.A.A.O.S. °Orthopaedic Surgery °Specializing in Arthroscopic and Reconstructive °Surgery of the Shoulder and Knee °[phone] °[address] - [place], [place] [address] - Fax [phone] ° ° °POST-OP TOTAL SHOULDER REPLACEMENT INSTRUCTIONS ° °1. Call the office at [phone] to schedule your first post-op appointment 10-14 days from the date of your surgery. ° °2. The bandage over your incision is waterproof. You may begin showering with this dressing on. You may leave this dressing on until first follow up appointment within 2 weeks. We prefer you leave this dressing in place until follow up however after 5-7 days if you are having itching or skin irritation and would like to remove it you may do so. Go slow and tug at the borders gently to break the bond the dressing has with the skin. At this point if there is no drainage it is okay to go without a bandage or you may cover it with a light guaze and tape. You can also expect significant bruising around your shoulder that will drift down your arm and into your chest wall. This is very normal and should resolve over several days. ° ° 3. Wear your sling/immobilizer at all times except to perform the exercises below or to occasionally let your arm dangle by your side to stretch your elbow. You also need to sleep in your sling immobilizer until instructed otherwise. ° °4. Range of motion to your elbow, wrist, and hand are encouraged 3-5 times daily. Exercise to your hand and fingers helps to reduce swelling you may experience. ° °5. Utilize ice to the shoulder 3-5 times minimum a day and additionally if you are experiencing pain. ° °6. Prescriptions for a pain medication and a muscle relaxant are provided for you. It is recommended that if you are experiencing pain that you pain medication alone is not controlling, add the muscle relaxant along with the pain medication which can give additional pain relief. The first 1-2 days  is generally the most severe of your pain and then should gradually decrease. As your pain lessens it is recommended that you decrease your use of the pain medications to an "as needed basis'" only and to always comply with the recommended dosages of the pain medications. ° °7. Pain medications can produce constipation along with their use. If you experience this, the use of an over the counter stool softener or laxative daily is recommended.  ° °8. For additional questions or concerns, please do not hesitate to call the office. If after hours there is an answering service to forward your concerns to the physician on call. ° °9.Pain control following an exparel block ° °To help control your post-operative pain you received a nerve block  performed with Exparel which is a long acting anesthetic (numbing agent) which can provide pain relief and sensations of numbness (and relief of pain) in the operative shoulder and arm for up to 3 days. Sometimes it provides mixed relief, meaning you may still have numbness in certain areas of the arm but can still be able to move  parts of that arm, hand, and fingers. We recommend that your prescribed pain medications  be used as needed. We do not feel it is necessary to "pre medicate" and "stay ahead" of pain.  Taking narcotic pain medications when you are not having any pain can lead to unnecessary and potentially dangerous side effects.  ° °POST-OP EXERCISES ° °Pendulum Exercises ° °Perform pendulum exercises while standing and bending at   the waist. Support your uninvolved arm on a table or chair and allow your operated arm to hang freely. Make sure to do these exercises passively - not using you shoulder muscles. ° °Repeat 20 times. Do 3 sessions per day. ° ° ° ° °

## 2017-09-04 ENCOUNTER — Encounter (HOSPITAL_COMMUNITY): Payer: Self-pay | Admitting: Orthopedic Surgery

## 2017-09-04 LAB — GLUCOSE, CAPILLARY: Glucose-Capillary: 119 mg/dL — ABNORMAL HIGH (ref 70–99)

## 2017-09-04 MED ORDER — BUPIVACAINE HCL (PF) 0.5 % IJ SOLN
INTRAMUSCULAR | Status: DC | PRN
  Administered 2017-09-03: 15 mL via PERINEURAL

## 2017-09-04 MED ORDER — TRAMADOL HCL 50 MG PO TABS: 50.0000 mg | ORAL_TABLET | Freq: Four times a day (QID) | ORAL | 0 refills | Status: DC | PRN

## 2017-09-04 MED ORDER — ONDANSETRON HCL 4 MG PO TABS: 4.0000 mg | ORAL_TABLET | Freq: Three times a day (TID) | ORAL | 0 refills | Status: DC | PRN

## 2017-09-04 MED ORDER — BUPIVACAINE LIPOSOME 1.3 % IJ SUSP
INTRAMUSCULAR | Status: DC | PRN
  Administered 2017-09-03: 10 mL via PERINEURAL

## 2017-09-04 NOTE — Evaluation (Signed)
Occupational Therapy Evaluation Patient Details Name: Jill Fisher MRN: [id number] DOB: May 02, 1934 Today's Date: 09/04/2017    History of Present Illness Pt is an 83 y.o. female s/p R Reverse TSA. PMHx: Cerebrovasualr disease, CAD, DM2, GERD, HLD, HTN.    Clinical Impression   Pt reports she was independent with ADL PTA. Currently pt supervision for ADL and functional mobility with the exception of min assist for UB/LB dressing. All shoulder, safety, and ADL education completed with pt. Pt planning to d/c home with 24/7 supervision from family. Pt is ready for d/c from an OT standpoint but would benefit from continued skilled OT to address established goals.    Follow Up Recommendations  Follow surgeon's recommendation for DC plan and follow-up therapies;Supervision - Intermittent    Equipment Recommendations  None recommended by OT    Recommendations for Other Services       Precautions / Restrictions Precautions Precautions: Fall;Shoulder Type of Shoulder Precautions: ADL parameters: FF 60, ABD 45, ER 20. Pendulums and lap slides OK. AROM elbow/wrist/hand OK. Shoulder Interventions: Shoulder sling/immobilizer;At all times;Off for dressing/bathing/exercises(can remove in controlled environment) Precaution Booklet Issued: Yes (comment) Required Braces or Orthoses: Sling Restrictions Weight Bearing Restrictions: Yes RUE Weight Bearing: Non weight bearing      Mobility Bed Mobility               General bed mobility comments: Pt OOB in chair upon arrival  Transfers Overall transfer level: Needs assistance Equipment used: None Transfers: Sit to/from Stand Sit to Stand: Supervision         General transfer comment: for safety, no unsteadiness or LOB    Balance Overall balance assessment: No apparent balance deficits (not formally assessed)                                         ADL either performed or assessed with clinical judgement   ADL  Overall ADL's : Needs assistance/impaired Eating/Feeding: Set up;Sitting   Grooming: Supervision/safety;Standing   Upper Body Bathing: Set up;Supervision/ safety;Sitting Upper Body Bathing Details (indicate cue type and reason): Educated pt on compensatory strategies for UB bathing Lower Body Bathing: Supervison/ safety;Sit to/from stand   Upper Body Dressing : Minimal assistance;Sitting Upper Body Dressing Details (indicate cue type and reason): Educated on compensatory strategies for UB dressing Lower Body Dressing: Minimal assistance;Sit to/from stand Lower Body Dressing Details (indicate cue type and reason): Assist to start clothing over feet; reports daughter/husband can assist with this if needed Toilet Transfer: Supervision/safety;Ambulation Toilet Transfer Details (indicate cue type and reason): Simulated in room     Tub/ Shower Transfer: Supervision/safety;Tub transfer;Ambulation;Grab bars Tub/Shower Transfer Details (indicate cue type and reason): Recommend use of shower chair; pt agrees and verbalized understanding Functional mobility during ADLs: Supervision/safety General ADL Comments: Educated pt on sling management and wear schedule, ice for edema and pain, proper positioning of RUE in bed/chair, RUE NWB.     Vision         Perception     Praxis      Pertinent Vitals/Pain Pain Assessment: No/denies pain     Hand Dominance Right   Extremity/Trunk Assessment Upper Extremity Assessment Upper Extremity Assessment: RUE deficits/detail RUE Deficits / Details: Nerve block still intact. Numbness throughout RUE. Able to wiggle fingers. RUE: Unable to fully assess due to immobilization RUE Sensation: decreased light touch RUE Coordination: decreased gross motor   Lower Extremity  Assessment Lower Extremity Assessment: Overall WFL for tasks assessed   Cervical / Trunk Assessment Cervical / Trunk Assessment: Normal   Communication Communication Communication: No  difficulties   Cognition Arousal/Alertness: Awake/alert Behavior During Therapy: WFL for tasks assessed/performed Overall Cognitive Status: Within Functional Limits for tasks assessed                                     General Comments       Exercises Exercises: Shoulder Shoulder Exercises Pendulum Exercise: Right;10 reps;Standing(R<>L, front<>back, circle) Elbow Flexion: AAROM;Right;10 reps;Seated Elbow Extension: AAROM;Right;10 reps;Seated Wrist Flexion: AROM;Right;10 reps;Seated Wrist Extension: AROM;Right;10 reps;Seated Digit Composite Flexion: AROM;Right;10 reps;Seated Composite Extension: AROM;Right;10 reps;Seated   Shoulder Instructions Shoulder Instructions Donning/doffing shirt without moving shoulder: Minimal assistance Method for sponge bathing under operated UE: Supervision/safety Donning/doffing sling/immobilizer: Minimal assistance Correct positioning of sling/immobilizer: Supervision/safety Pendulum exercises (written home exercise program): Supervision/safety ROM for elbow, wrist and digits of operated UE: Minimal assistance Sling wearing schedule (on at all times/off for ADL's): Independent Proper positioning of operated UE when showering: Supervision/safety Positioning of UE while sleeping: Minimal assistance    Home Living Family/patient expects to be discharged to:: Private residence Living Arrangements: Spouse/significant other Available Help at Discharge: Family;Available 24 hours/day Type of Home: House Home Access: Stairs to enter [employer] of Steps: 2 Entrance Stairs-Rails: Right Home Layout: One level     Bathroom Shower/Tub: Teacher, early years/pre: Handicapped height     Home Equipment: Shower seat;Grab bars - toilet;Grab bars - tub/shower   Additional Comments: Daughter is planning to stay through the weekend      Prior Functioning/Environment Level of Independence: Independent                  OT Problem List: Decreased strength;Decreased range of motion;Impaired balance (sitting and/or standing);Decreased knowledge of precautions;Pain;Impaired UE functional use      OT Treatment/Interventions: Self-care/ADL training;Therapeutic exercise;DME and/or AE instruction;Therapeutic activities;Patient/family education;Balance training    OT Goals(Current goals can be found in the care plan section) Acute Rehab OT Goals Patient Stated Goal: get back to being active and independent OT Goal Formulation: With patient Time For Goal Achievement: 09/18/17 Potential to Achieve Goals: Good ADL Goals Pt Will Perform Upper Body Bathing: with modified independence;standing;sitting Pt Will Perform Upper Body Dressing: with modified independence;sitting;standing Additional ADL Goal #1: Pt will independently perform RUE pendulums, lap slides, and AROM elbow/wrist/hand x10 each. Additional ADL Goal #2: Pt/caregiver will independently don/doff sling.  OT Frequency: Min 2X/week   Barriers to D/C:            Co-evaluation              AM-PAC PT "6 Clicks" Daily Activity     Outcome Measure Help from another person eating meals?: A Little Help from another person taking care of personal grooming?: A Little Help from another person toileting, which includes using toliet, bedpan, or urinal?: A Little Help from another person bathing (including washing, rinsing, drying)?: A Little Help from another person to put on and taking off regular upper body clothing?: A Little Help from another person to put on and taking off regular lower body clothing?: A Little 6 Click Score: 18   End of Session Equipment Utilized During Treatment: Other (comment)(sling) Nurse Communication: Mobility status;Other (comment)(pt ready for d/c)  Activity Tolerance: Patient tolerated treatment well Patient left: in chair;with call bell/phone within reach  OT Visit Diagnosis: Pain Pain - Right/Left:  Right Pain - part of body: Shoulder                Time: 5217-4715 OT Time Calculation (min): 33 min Charges:  OT General Charges $OT Visit: 1 Visit OT Evaluation $OT Eval Moderate Complexity: 1 Mod OT Treatments $Self Care/Home Management : 8-22 mins G-Codes:     Avleen Bordwell A. Ulice Brilliant, M.S., OTR/L Acute Rehab Department: [phone]  Binnie Kand 09/04/2017, 9:25 AM

## 2017-09-04 NOTE — Anesthesia Procedure Notes (Addendum)
Anesthesia Regional Block: Interscalene brachial plexus block   Pre-Anesthetic Checklist: ,, timeout performed, Correct Patient, Correct Site, Correct Laterality, Correct Procedure, Correct Position, site marked, Risks and benefits discussed,  Surgical consent,  Pre-op evaluation,  At surgeon's request and post-op pain management  Laterality: Right  Prep: chloraprep       Needles:  Injection technique: Single-shot  Needle Type: Echogenic Stimulator Needle     Needle Length: 5cm  Needle Gauge: 22     Additional Needles:   Procedures:,,,, ultrasound used (permanent image in chart),,,,  Narrative:  Start time: 09/03/2017 10:01 AM End time: 09/03/2017 10:05 AM Injection made incrementally with aspirations every 5 mL.  Performed by: Personally  Anesthesiologist: Oleta Mouse, MD  Additional Notes: Functioning IV was confirmed and monitors applied.  A 66mm 22ga echogenic arrow stimulator was used. Sterile prep and drape,hand hygiene and sterile gloves were used.Ultrasound guidance: relevant anatomy identified, needle position confirmed, local anesthetic spread visualized around nerve(s)., vascular puncture avoided.  Image printed for medical record.  Negative aspiration and negative test dose prior to incremental administration of local anesthetic. The patient tolerated the procedure well.

## 2017-09-04 NOTE — Anesthesia Postprocedure Evaluation (Signed)
Anesthesia Post Note  Patient: Jill Fisher  Procedure(s) Performed: REVERSE RIGHT SHOULDER ARTHROPLASTY (Right Shoulder)     Patient location during evaluation: PACU Anesthesia Type: Regional and General Level of consciousness: awake and alert Pain management: pain level controlled Vital Signs Assessment: post-procedure vital signs reviewed and stable Respiratory status: spontaneous breathing, nonlabored ventilation, respiratory function stable and patient connected to nasal cannula oxygen Cardiovascular status: blood pressure returned to baseline and stable Postop Assessment: no apparent nausea or vomiting Anesthetic complications: no    Last Vitals:  Vitals:   09/03/17 2353 09/04/17 0500  BP: (!) 112/50 125/70  Pulse: 63 69  Resp:  16  Temp: 36.8 C 36.9 C  SpO2: 93% 99%    Last Pain:  Vitals:   09/04/17 0821  TempSrc:   PainSc: 0-No pain                 [name]

## 2017-09-04 NOTE — Discharge Summary (Signed)
PATIENT ID:      Jill Fisher  MRN:     [id number] DOB/AGE:    [birth year] / 81 y.o.     DISCHARGE SUMMARY  ADMISSION DATE:    09/03/2017 DISCHARGE DATE:    ADMISSION DIAGNOSIS: right shoulder osteoarthritis Past Medical History:  Diagnosis Date  . Allergic arthritis of shoulder region, right   . Cerebrovascular disease    followed at [facility]  . Coronary artery disease    LVEF  65%  . Diabetes mellitus without complication ([place])    Type II  . GERD (gastroesophageal reflux disease)   . Hyperlipidemia   . Hypertension     DISCHARGE DIAGNOSIS:   Active Problems:   S/p reverse total shoulder arthroplasty   PROCEDURE: Procedure(s): REVERSE RIGHT SHOULDER ARTHROPLASTY on 09/03/2017  CONSULTS:    HISTORY:  See H&P in chart.  HOSPITAL COURSE:  Jill Fisher is a 83 y.o. admitted on 09/03/2017 with a diagnosis of right shoulder osteoarthritis.  They were brought to the operating room on 09/03/2017 and underwent Procedure(s): REVERSE RIGHT SHOULDER ARTHROPLASTY.    They were given perioperative antibiotics:  Anti-infectives (From admission, onward)   Start     Dose/Rate Route Frequency Ordered Stop   09/03/17 0900  ceFAZolin (ANCEF) IVPB 2g/100 mL premix     2 g 200 mL/hr over 30 Minutes Intravenous To Short Stay 09/02/17 1316 09/03/17 1052    .  Patient underwent the above named procedure and tolerated it well. The following day they were hemodynamically stable and pain was controlled on oral analgesics. They were neurovascularly intact to the operative extremity. Her block was still working as expected OT was ordered and worked with patient per protocol. They were medically and orthopaedically stable for discharge on day 1. Home health services were arranged   DIAGNOSTIC STUDIES:  RECENT RADIOGRAPHIC STUDIES :  Ct Shoulder Right Wo Contrast  Result Date: 08/21/2017 CLINICAL DATA:  Right shoulder pain with limited range of motion for 6 months. EXAM: CT OF THE UPPER RIGHT EXTREMITY  WITHOUT CONTRAST TECHNIQUE: Multidetector CT imaging of the right shoulder was performed according to the standard protocol. COMPARISON:  Chest CT 07/18/2017. FINDINGS: Bones/Joint/Cartilage There are severe glenohumeral degenerative changes with diffuse chondral thinning, osteophytes and subchondral cyst formation. There is mild flattening of the humeral head. There is a moderate to large joint effusion with multiple small calcified loose bodies and chondrocalcinosis. Moderate acromioclavicular degenerative changes are present with associated chondrocalcinosis. Ligaments Suboptimally assessed by CT. Muscles and Tendons The subacromial space appears mildly narrowed, however there is no gross disruption of the rotator cuff or associated muscular atrophy. There is some calcification within the supraspinatus and infraspinatus tendons. Soft tissues No periarticular inflammatory changes or bursal fluid collection are seen. There is no axillary lymphadenopathy. Small calcified right lower lobe pulmonary granuloma noted. IMPRESSION: Severe right shoulder arthropathy with joint effusion, loose bodies and chondrocalcinosis, most consistent with CPPD arthropathy. Grossly intact rotator cuff with associated calcific tendinitis. Electronically Signed   By: Richardean Sale M.D.   On: 08/21/2017 16:39    RECENT VITAL SIGNS:   Patient Vitals for the past 24 hrs:  BP Temp Temp src Pulse Resp SpO2  09/04/17 0500 125/70 98.4 F (36.9 C) Oral 69 16 99 %  09/03/17 2353 (!) 112/50 98.3 F (36.8 C) Oral 63 - 93 %  09/03/17 1953 (!) 119/56 (!) 97.5 F (36.4 C) Oral 71 15 94 %  09/03/17 1412 140/71 (!) 97.5 F (36.4 C) Oral  85 - 95 %  09/03/17 1345 (!) 141/69 (!) 97 F (36.1 C) - 86 16 98 %  09/03/17 1330 139/69 - - 85 15 95 %  09/03/17 1315 (!) 141/68 - - 91 17 97 %  09/03/17 1300 (!) 148/88 - - (!) 105 19 95 %  09/03/17 1256 - - - 91 18 94 %  09/03/17 1255 (!) 154/89 - - - - -  09/03/17 1245 - (!) 97.3 F (36.3 C) -  93 19 96 %  09/03/17 1239 (!) 152/76 - - - - -  09/03/17 1015 - - - 95 18 97 %  09/03/17 1010 - - - 86 16 98 %  09/03/17 1005 - - - 82 16 97 %  09/03/17 1000 - - - 85 20 98 %  09/03/17 0819 (!) 150/70 98 F (36.7 C) Oral 83 17 98 %  .  RECENT EKG RESULTS:    Orders placed or performed during the hospital encounter of 08/27/17  . EKG 12 lead  . EKG 12 lead    DISCHARGE INSTRUCTIONS:  Discharge Instructions    Discontinue IV   Complete by:  As directed       DISCHARGE MEDICATIONS:   Allergies as of 09/04/2017      Reactions   Codeine Anxiety, Other (See Comments)   Jitters       Medication List    TAKE these medications   ALPRAZolam 0.25 MG tablet Commonly known as:  XANAX Take 0.25 mg by mouth at bedtime.   aspirin EC 81 MG EC tablet Generic drug:  aspirin Take 81 mg by mouth daily. Swallow whole.   atenolol-chlorthalidone 50-25 MG tablet Commonly known as:  TENORETIC Take 1 tablet by mouth daily.   Fish Oil 1000 MG Caps Take 1,000 mg by mouth 2 (two) times daily.   glimepiride 1 MG tablet Commonly known as:  AMARYL Take 1 mg by mouth daily with breakfast.   lisinopril 2.5 MG tablet Commonly known as:  PRINIVIL,ZESTRIL Take 2.5 mg by mouth daily.   metFORMIN 1000 MG tablet Commonly known as:  GLUCOPHAGE Take 1,000 mg by mouth 2 (two) times daily with a meal.   ondansetron 4 MG tablet Commonly known as:  ZOFRAN Take 1 tablet (4 mg total) by mouth every 8 (eight) hours as needed for nausea or vomiting.   PRESERVISION AREDS 2 Caps Take 1 capsule by mouth daily.   PREVAGEN 10 MG Caps Generic drug:  Apoaequorin Take 10 mg by mouth daily.   traMADol 50 MG tablet Commonly known as:  ULTRAM Take 1 tablet (50 mg total) by mouth every 6 (six) hours as needed.   triamcinolone cream 0.1 % Commonly known as:  KENALOG Apply 1 application topically 2 (two) times daily as needed. For itchy legs.       FOLLOW UP VISIT:   Follow-up Information     Justice Britain, MD.   Specialty:  Orthopedic Surgery Why:  call to be seen in 10-14 days Contact information: [address] [place] [address] [phone]           DISCHARGE DJ:MEQA   DISCHARGE CONDITION:  Thereasa Parkin Dotty Gonzalo for Dr. Justice Britain 09/04/2017, 8:14 AM

## 2017-09-07 ENCOUNTER — Other Ambulatory Visit: Payer: Self-pay

## 2017-09-07 NOTE — Patient Outreach (Signed)
[facility]) Care Management  [place]   09/07/2017  Jill Fisher October 04, 1934 [id number]   83 year old female referred to [place] services for 30 day post discharge medication review.  PMHx includes, but not limited to, CAD, SVT, GERD, hyperlipidemia and Type 2 diabetes mellitus.   Successful outreach attempt to Jill Fisher.  HIPAA identifiers verified.  Subjective: Jill Fisher reports that she is feeling great after her recent right shoulder arthroplasty.  She report no pain at all.  She states that her doctor told her that it may take up to four days for the anesthesia block of her arm to wear off.  She said that her arm tingled some yesterday, but reports no additional tingling or pain.  She states that she fills her own pill box weekly and states that she has not affordability issues with her prescription medications.  Jill Fisher states that she is not interested in having a [facility] telephonic nurse or health coach contact her.    Objective:  From 08/27/17 HgA1c 6.1% SCr 0.9 mg/dL   Current Medications: Current Outpatient Medications  Medication Sig Dispense Refill  . acetaminophen (TYLENOL) 500 MG tablet Take 1,000 mg by mouth every 6 (six) hours as needed for moderate pain.    Marland Kitchen Apoaequorin (PREVAGEN) 10 MG CAPS Take 10 mg by mouth daily.     Marland Kitchen aspirin (ASPIRIN EC) 81 MG EC tablet Take 81 mg by mouth daily. Swallow whole.    Marland Kitchen atenolol-chlorthalidone (TENORETIC) 50-25 MG per tablet Take 1 tablet by mouth daily.    . Cholecalciferol (VITAMIN D) 2000 units tablet Take 2,000 Units by mouth daily.    Jill Fisher Oil (OMEGA-3) 500 MG CAPS Take 500 mg by mouth at bedtime.    Marland Kitchen lisinopril (PRINIVIL,ZESTRIL) 2.5 MG tablet Take 2.5 mg by mouth daily.     . metFORMIN (GLUCOPHAGE) 1000 MG tablet Take 1,000 mg by mouth 2 (two) times daily with a meal.     . Multiple Vitamins-Minerals (PRESERVISION AREDS 2) CAPS Take 1 capsule by mouth daily.    . traMADol (ULTRAM) 50 MG tablet  Take 1 tablet (50 mg total) by mouth every 6 (six) hours as needed. 30 tablet 0  . triamcinolone cream (KENALOG) 0.1 % Apply 1 application topically 2 (two) times daily as needed. For itchy legs.  0  . ondansetron (ZOFRAN) 4 MG tablet Take 1 tablet (4 mg total) by mouth every 8 (eight) hours as needed for nausea or vomiting. (Patient not taking: Reported on 09/07/2017) 10 tablet 0   No current facility-administered medications for this visit.     Functional Status: In your present state of health, do you have any difficulty performing the following activities: 08/27/2017  Hearing? N  Vision? N  Difficulty concentrating or making decisions? N  Walking or climbing stairs? N  Dressing or bathing? N  Doing errands, shopping? N  Some recent data might be hidden    Fall/Depression Screening: No flowsheet data found. No flowsheet data found.  ASSESSMENT: Date Discharged from Hospital: 09/04/17 Date Medication Reconciliation Performed: 09/07/2017  New Medications at Discharge:   tramadol  ondansetron  Patient was recently discharged from hospital and all medications have been reviewed  Drugs sorted by system:  Cardiovascular: aspirin, atenolol/chlorthalidone, omega-3, lisinopril   Gastrointestinal: ondansetron  Endocrine: metformin  Topical: triamcinolone cream   Pain: acetaminophen, tramadol  Vitamins/Minerals: cholecalciferol, MVI/minerals  Miscellaneous: apoqaequorin  Gaps in therapy:  Patient with Type 2 diabetes mellitus and  not on a statin.  Per cardiology note from 2010, patient is unable to tolerate pravastatin, simvastatin and atorvastatin.  She experiences myalgias and was started on fish oil.    Other issues noted:  Patient had glimepiride on her Epic medication list, but is adamant that she is only on metformin for diabetes.  Is she supposed to take this medication?  Counseled pain that if she starts to experience arm pain, that she should take her pain  medication.   It is easier to control pain if she takes pain medications when it starts to hurt, rather than letting it get out of control and trying to get it back under control.   She verbalized understanding and said that several people had told her that.    Plan: Route note to PCP, Jill Fisher.  Jill Fisher, PharmD Clinical Pharmacist [place] [phone]  Addendum: Incoming call received from Jill Fisher office.  Jill Fisher does not take glimepiride anymore.  She is only on metformin for diabetes.   Jill Fisher, PharmD Clinical Pharmacist [place] [phone]

## 2017-09-18 DIAGNOSIS — Z96611 Presence of right artificial shoulder joint: Secondary | ICD-10-CM | POA: Diagnosis not present

## 2017-09-18 DIAGNOSIS — Z471 Aftercare following joint replacement surgery: Secondary | ICD-10-CM | POA: Diagnosis not present

## 2017-11-05 DIAGNOSIS — F320 Major depressive disorder, single episode, mild: Secondary | ICD-10-CM | POA: Diagnosis not present

## 2017-11-05 DIAGNOSIS — E119 Type 2 diabetes mellitus without complications: Secondary | ICD-10-CM | POA: Diagnosis not present

## 2017-11-05 DIAGNOSIS — F418 Other specified anxiety disorders: Secondary | ICD-10-CM | POA: Diagnosis not present

## 2017-11-05 DIAGNOSIS — K210 Gastro-esophageal reflux disease with esophagitis: Secondary | ICD-10-CM | POA: Diagnosis not present

## 2017-11-05 DIAGNOSIS — I10 Essential (primary) hypertension: Secondary | ICD-10-CM | POA: Diagnosis not present

## 2017-11-25 DIAGNOSIS — Z6824 Body mass index (BMI) 24.0-24.9, adult: Secondary | ICD-10-CM | POA: Diagnosis not present

## 2017-11-25 DIAGNOSIS — Z1389 Encounter for screening for other disorder: Secondary | ICD-10-CM | POA: Diagnosis not present

## 2017-11-25 DIAGNOSIS — F418 Other specified anxiety disorders: Secondary | ICD-10-CM | POA: Diagnosis not present

## 2017-11-25 DIAGNOSIS — F320 Major depressive disorder, single episode, mild: Secondary | ICD-10-CM | POA: Diagnosis not present

## 2017-11-25 DIAGNOSIS — E119 Type 2 diabetes mellitus without complications: Secondary | ICD-10-CM | POA: Diagnosis not present

## 2017-11-25 DIAGNOSIS — I10 Essential (primary) hypertension: Secondary | ICD-10-CM | POA: Diagnosis not present

## 2017-11-25 DIAGNOSIS — Z6825 Body mass index (BMI) 25.0-25.9, adult: Secondary | ICD-10-CM | POA: Diagnosis not present

## 2017-11-25 DIAGNOSIS — Z0000 Encounter for general adult medical examination without abnormal findings: Secondary | ICD-10-CM | POA: Diagnosis not present

## 2017-11-25 DIAGNOSIS — K210 Gastro-esophageal reflux disease with esophagitis: Secondary | ICD-10-CM | POA: Diagnosis not present

## 2017-12-10 DIAGNOSIS — F418 Other specified anxiety disorders: Secondary | ICD-10-CM | POA: Diagnosis not present

## 2017-12-10 DIAGNOSIS — I10 Essential (primary) hypertension: Secondary | ICD-10-CM | POA: Diagnosis not present

## 2017-12-10 DIAGNOSIS — E119 Type 2 diabetes mellitus without complications: Secondary | ICD-10-CM | POA: Diagnosis not present

## 2017-12-10 DIAGNOSIS — K210 Gastro-esophageal reflux disease with esophagitis: Secondary | ICD-10-CM | POA: Diagnosis not present

## 2017-12-23 DIAGNOSIS — Z471 Aftercare following joint replacement surgery: Secondary | ICD-10-CM | POA: Diagnosis not present

## 2017-12-23 DIAGNOSIS — Z96611 Presence of right artificial shoulder joint: Secondary | ICD-10-CM | POA: Diagnosis not present

## 2017-12-28 DIAGNOSIS — C44729 Squamous cell carcinoma of skin of left lower limb, including hip: Secondary | ICD-10-CM | POA: Diagnosis not present

## 2017-12-28 DIAGNOSIS — M810 Age-related osteoporosis without current pathological fracture: Secondary | ICD-10-CM | POA: Diagnosis not present

## 2017-12-28 DIAGNOSIS — M8588 Other specified disorders of bone density and structure, other site: Secondary | ICD-10-CM | POA: Diagnosis not present

## 2017-12-28 DIAGNOSIS — L570 Actinic keratosis: Secondary | ICD-10-CM | POA: Diagnosis not present

## 2017-12-31 DIAGNOSIS — C44629 Squamous cell carcinoma of skin of left upper limb, including shoulder: Secondary | ICD-10-CM | POA: Diagnosis not present

## 2017-12-31 DIAGNOSIS — G5603 Carpal tunnel syndrome, bilateral upper limbs: Secondary | ICD-10-CM | POA: Diagnosis not present

## 2018-01-05 DIAGNOSIS — E113393 Type 2 diabetes mellitus with moderate nonproliferative diabetic retinopathy without macular edema, bilateral: Secondary | ICD-10-CM | POA: Diagnosis not present

## 2018-01-26 DIAGNOSIS — F418 Other specified anxiety disorders: Secondary | ICD-10-CM | POA: Diagnosis not present

## 2018-01-26 DIAGNOSIS — E119 Type 2 diabetes mellitus without complications: Secondary | ICD-10-CM | POA: Diagnosis not present

## 2018-01-26 DIAGNOSIS — I10 Essential (primary) hypertension: Secondary | ICD-10-CM | POA: Diagnosis not present

## 2018-01-26 DIAGNOSIS — K210 Gastro-esophageal reflux disease with esophagitis: Secondary | ICD-10-CM | POA: Diagnosis not present

## 2018-02-25 DIAGNOSIS — M47816 Spondylosis without myelopathy or radiculopathy, lumbar region: Secondary | ICD-10-CM | POA: Diagnosis not present

## 2018-02-25 DIAGNOSIS — I700 Atherosclerosis of aorta: Secondary | ICD-10-CM | POA: Diagnosis not present

## 2018-02-25 DIAGNOSIS — Z0000 Encounter for general adult medical examination without abnormal findings: Secondary | ICD-10-CM | POA: Diagnosis not present

## 2018-02-25 DIAGNOSIS — M549 Dorsalgia, unspecified: Secondary | ICD-10-CM | POA: Diagnosis not present

## 2018-02-25 DIAGNOSIS — Z6825 Body mass index (BMI) 25.0-25.9, adult: Secondary | ICD-10-CM | POA: Diagnosis not present

## 2018-02-25 DIAGNOSIS — M47814 Spondylosis without myelopathy or radiculopathy, thoracic region: Secondary | ICD-10-CM | POA: Diagnosis not present

## 2018-02-25 DIAGNOSIS — M5136 Other intervertebral disc degeneration, lumbar region: Secondary | ICD-10-CM | POA: Diagnosis not present

## 2018-03-17 DIAGNOSIS — I10 Essential (primary) hypertension: Secondary | ICD-10-CM | POA: Diagnosis not present

## 2018-03-17 DIAGNOSIS — E119 Type 2 diabetes mellitus without complications: Secondary | ICD-10-CM | POA: Diagnosis not present

## 2018-04-07 DIAGNOSIS — M170 Bilateral primary osteoarthritis of knee: Secondary | ICD-10-CM | POA: Diagnosis not present

## 2018-04-13 DIAGNOSIS — C44729 Squamous cell carcinoma of skin of left lower limb, including hip: Secondary | ICD-10-CM | POA: Diagnosis not present

## 2018-04-15 DIAGNOSIS — L7622 Postprocedural hemorrhage and hematoma of skin and subcutaneous tissue following other procedure: Secondary | ICD-10-CM | POA: Diagnosis not present

## 2018-04-20 DIAGNOSIS — M79641 Pain in right hand: Secondary | ICD-10-CM | POA: Diagnosis not present

## 2018-04-20 DIAGNOSIS — G5603 Carpal tunnel syndrome, bilateral upper limbs: Secondary | ICD-10-CM | POA: Diagnosis not present

## 2018-04-21 DIAGNOSIS — L0100 Impetigo, unspecified: Secondary | ICD-10-CM | POA: Diagnosis not present

## 2018-04-21 DIAGNOSIS — L97921 Non-pressure chronic ulcer of unspecified part of left lower leg limited to breakdown of skin: Secondary | ICD-10-CM | POA: Diagnosis not present

## 2018-04-21 DIAGNOSIS — I872 Venous insufficiency (chronic) (peripheral): Secondary | ICD-10-CM | POA: Diagnosis not present

## 2018-04-21 DIAGNOSIS — L570 Actinic keratosis: Secondary | ICD-10-CM | POA: Diagnosis not present

## 2018-04-28 DIAGNOSIS — L97929 Non-pressure chronic ulcer of unspecified part of left lower leg with unspecified severity: Secondary | ICD-10-CM | POA: Diagnosis not present

## 2018-04-28 DIAGNOSIS — R600 Localized edema: Secondary | ICD-10-CM | POA: Diagnosis not present

## 2018-04-28 DIAGNOSIS — I872 Venous insufficiency (chronic) (peripheral): Secondary | ICD-10-CM | POA: Diagnosis not present

## 2018-05-05 DIAGNOSIS — L97929 Non-pressure chronic ulcer of unspecified part of left lower leg with unspecified severity: Secondary | ICD-10-CM | POA: Diagnosis not present

## 2018-05-05 DIAGNOSIS — I872 Venous insufficiency (chronic) (peripheral): Secondary | ICD-10-CM | POA: Diagnosis not present

## 2018-05-05 DIAGNOSIS — L0100 Impetigo, unspecified: Secondary | ICD-10-CM | POA: Diagnosis not present

## 2018-05-10 DIAGNOSIS — G5603 Carpal tunnel syndrome, bilateral upper limbs: Secondary | ICD-10-CM | POA: Diagnosis not present

## 2018-05-12 DIAGNOSIS — C44729 Squamous cell carcinoma of skin of left lower limb, including hip: Secondary | ICD-10-CM | POA: Diagnosis not present

## 2018-06-03 DIAGNOSIS — G5603 Carpal tunnel syndrome, bilateral upper limbs: Secondary | ICD-10-CM | POA: Diagnosis not present

## 2018-06-14 DIAGNOSIS — E119 Type 2 diabetes mellitus without complications: Secondary | ICD-10-CM | POA: Diagnosis not present

## 2018-06-14 DIAGNOSIS — I10 Essential (primary) hypertension: Secondary | ICD-10-CM | POA: Diagnosis not present

## 2018-06-16 DIAGNOSIS — I10 Essential (primary) hypertension: Secondary | ICD-10-CM | POA: Diagnosis not present

## 2018-06-16 DIAGNOSIS — E119 Type 2 diabetes mellitus without complications: Secondary | ICD-10-CM | POA: Diagnosis not present

## 2018-06-16 DIAGNOSIS — Z6823 Body mass index (BMI) 23.0-23.9, adult: Secondary | ICD-10-CM | POA: Diagnosis not present

## 2018-06-16 DIAGNOSIS — M545 Low back pain: Secondary | ICD-10-CM | POA: Diagnosis not present

## 2018-07-22 DIAGNOSIS — Z6823 Body mass index (BMI) 23.0-23.9, adult: Secondary | ICD-10-CM | POA: Diagnosis not present

## 2018-07-27 DIAGNOSIS — E119 Type 2 diabetes mellitus without complications: Secondary | ICD-10-CM | POA: Diagnosis not present

## 2018-07-27 DIAGNOSIS — M7912 Myalgia of auxiliary muscles, head and neck: Secondary | ICD-10-CM | POA: Diagnosis not present

## 2018-07-27 DIAGNOSIS — I10 Essential (primary) hypertension: Secondary | ICD-10-CM | POA: Diagnosis not present

## 2018-08-24 DIAGNOSIS — M7912 Myalgia of auxiliary muscles, head and neck: Secondary | ICD-10-CM | POA: Diagnosis not present

## 2018-08-24 DIAGNOSIS — E119 Type 2 diabetes mellitus without complications: Secondary | ICD-10-CM | POA: Diagnosis not present

## 2018-08-24 DIAGNOSIS — I10 Essential (primary) hypertension: Secondary | ICD-10-CM | POA: Diagnosis not present

## 2018-09-08 DIAGNOSIS — Z1389 Encounter for screening for other disorder: Secondary | ICD-10-CM | POA: Diagnosis not present

## 2018-09-08 DIAGNOSIS — E785 Hyperlipidemia, unspecified: Secondary | ICD-10-CM | POA: Diagnosis not present

## 2018-09-08 DIAGNOSIS — E119 Type 2 diabetes mellitus without complications: Secondary | ICD-10-CM | POA: Diagnosis not present

## 2018-09-08 DIAGNOSIS — I10 Essential (primary) hypertension: Secondary | ICD-10-CM | POA: Diagnosis not present

## 2018-09-08 DIAGNOSIS — Z0000 Encounter for general adult medical examination without abnormal findings: Secondary | ICD-10-CM | POA: Diagnosis not present

## 2018-09-09 DIAGNOSIS — E113393 Type 2 diabetes mellitus with moderate nonproliferative diabetic retinopathy without macular edema, bilateral: Secondary | ICD-10-CM | POA: Diagnosis not present

## 2018-10-16 IMAGING — DX DG CHEST 2V
2 series · 2 of 2 positions shown · non-contrast
Comparison: 04/22/2015

CLINICAL DATA: Chest and right arm pain.

EXAM:
CHEST  2 VIEW

[chest pa]
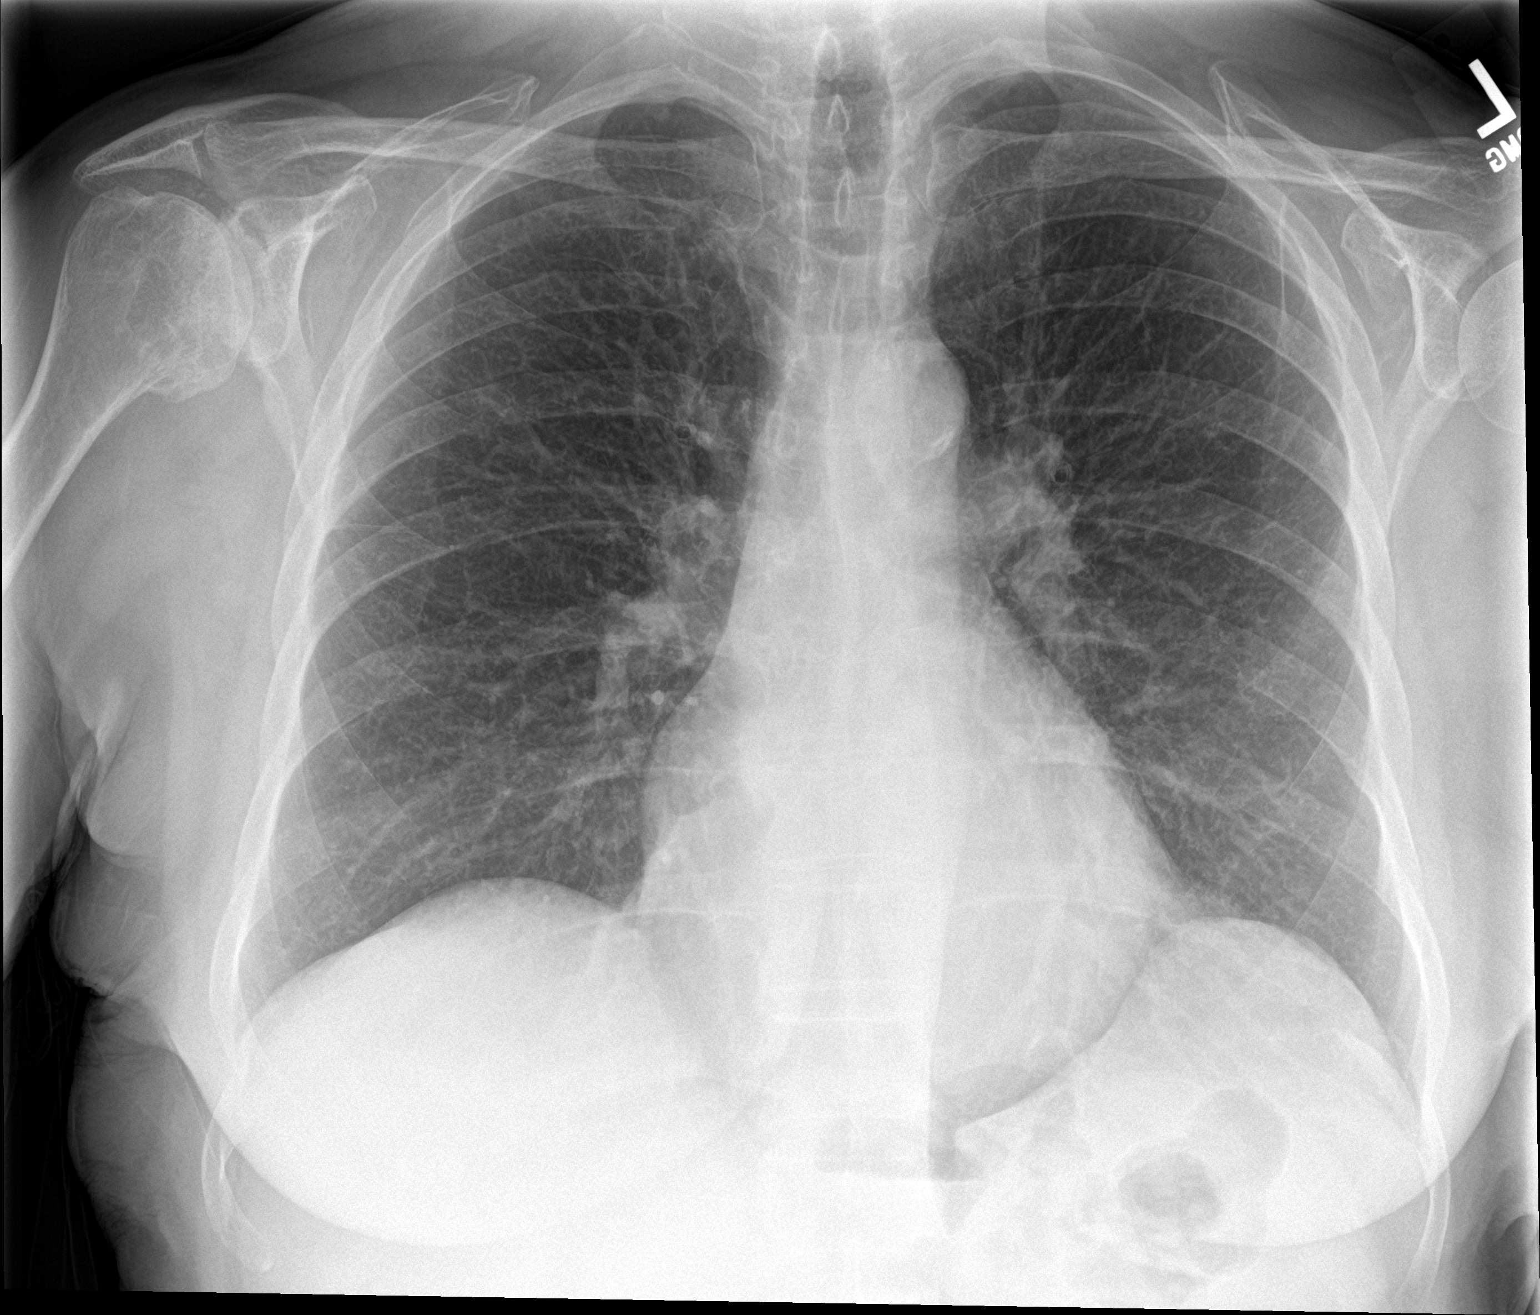

[chest lat]
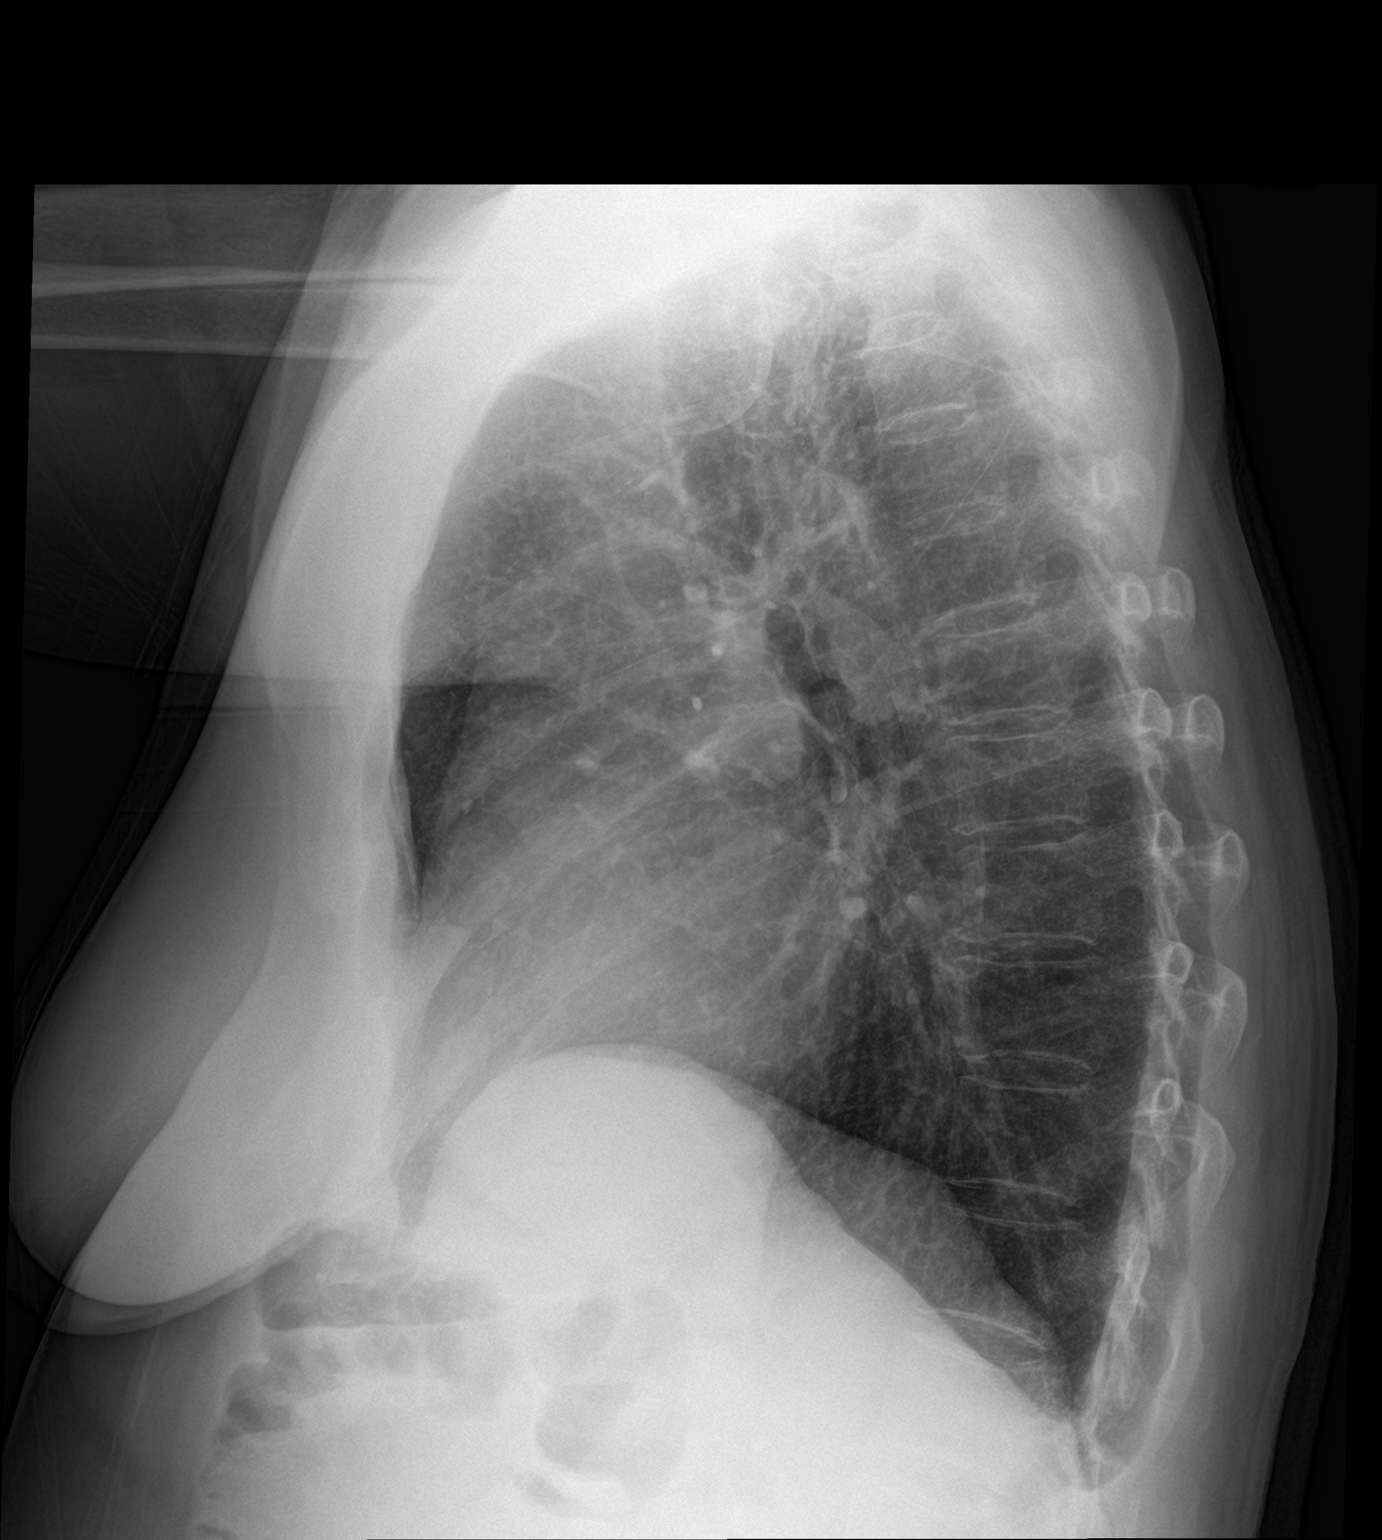

[2 of 2 positions shown; findings below may reference images not displayed]

FINDINGS: The cardiac silhouette, mediastinal and hilar contours are within
normal limits and stable. There is mild tortuosity and calcification
of the thoracic aorta. The lungs are clear. No pleural effusion.
Stable eventration of both hemidiaphragms. The bony thorax is
intact. Moderate degenerative changes involving the right shoulder
joint. Remote healed left rib fracture noted.
IMPRESSION: No acute cardiopulmonary findings.

## 2018-10-27 DIAGNOSIS — I10 Essential (primary) hypertension: Secondary | ICD-10-CM | POA: Diagnosis not present

## 2018-10-27 DIAGNOSIS — E785 Hyperlipidemia, unspecified: Secondary | ICD-10-CM | POA: Diagnosis not present

## 2018-10-29 ENCOUNTER — Other Ambulatory Visit: Payer: Self-pay

## 2018-10-29 NOTE — Patient Outreach (Signed)
[facility]) Care Management  10/29/2018  FEIGY BUENGER 08-13-1934 AN:[id number]   Medication Adherence call to Mrs. Karliyah Krausse [place] Identifiers Verify spoke with patient she is past due on Metformin 1000 mg,Glimepiride 1 mg and Lisinopril 2.5 mg patient explain she explain she is no longer taking Glimepiride 1 mg doctor took her off on Metformin 1000 mg and Lisinopril 2.5 mg patient takes it on a regular basis and her pharmacy fills it on the date there due. Patient has received the ones for August.Mrs. Negro is showing past due under [place].   [place] Management Direct Dial [phone]  Fax [phone] [email or web address]

## 2018-11-30 DIAGNOSIS — E782 Mixed hyperlipidemia: Secondary | ICD-10-CM | POA: Diagnosis not present

## 2018-11-30 DIAGNOSIS — I10 Essential (primary) hypertension: Secondary | ICD-10-CM | POA: Diagnosis not present

## 2018-12-16 DIAGNOSIS — E785 Hyperlipidemia, unspecified: Secondary | ICD-10-CM | POA: Diagnosis not present

## 2018-12-16 DIAGNOSIS — I10 Essential (primary) hypertension: Secondary | ICD-10-CM | POA: Diagnosis not present

## 2018-12-16 DIAGNOSIS — J40 Bronchitis, not specified as acute or chronic: Secondary | ICD-10-CM | POA: Diagnosis not present

## 2018-12-16 DIAGNOSIS — Z131 Encounter for screening for diabetes mellitus: Secondary | ICD-10-CM | POA: Diagnosis not present

## 2018-12-16 DIAGNOSIS — E7849 Other hyperlipidemia: Secondary | ICD-10-CM | POA: Diagnosis not present

## 2018-12-16 DIAGNOSIS — Z0000 Encounter for general adult medical examination without abnormal findings: Secondary | ICD-10-CM | POA: Diagnosis not present

## 2019-02-17 DIAGNOSIS — E7849 Other hyperlipidemia: Secondary | ICD-10-CM | POA: Diagnosis not present

## 2019-02-17 DIAGNOSIS — I10 Essential (primary) hypertension: Secondary | ICD-10-CM | POA: Diagnosis not present

## 2019-03-16 DIAGNOSIS — E119 Type 2 diabetes mellitus without complications: Secondary | ICD-10-CM | POA: Diagnosis not present

## 2019-03-16 DIAGNOSIS — Z1389 Encounter for screening for other disorder: Secondary | ICD-10-CM | POA: Diagnosis not present

## 2019-03-16 DIAGNOSIS — J40 Bronchitis, not specified as acute or chronic: Secondary | ICD-10-CM | POA: Diagnosis not present

## 2019-03-16 DIAGNOSIS — E7849 Other hyperlipidemia: Secondary | ICD-10-CM | POA: Diagnosis not present

## 2019-03-16 DIAGNOSIS — I10 Essential (primary) hypertension: Secondary | ICD-10-CM | POA: Diagnosis not present

## 2019-03-16 DIAGNOSIS — Z0000 Encounter for general adult medical examination without abnormal findings: Secondary | ICD-10-CM | POA: Diagnosis not present

## 2019-04-01 DIAGNOSIS — E119 Type 2 diabetes mellitus without complications: Secondary | ICD-10-CM | POA: Diagnosis not present

## 2019-04-01 DIAGNOSIS — E7849 Other hyperlipidemia: Secondary | ICD-10-CM | POA: Diagnosis not present

## 2019-04-01 DIAGNOSIS — I10 Essential (primary) hypertension: Secondary | ICD-10-CM | POA: Diagnosis not present

## 2019-04-07 DIAGNOSIS — J158 Pneumonia due to other specified bacteria: Secondary | ICD-10-CM | POA: Diagnosis not present

## 2019-05-25 DIAGNOSIS — M791 Myalgia, unspecified site: Secondary | ICD-10-CM | POA: Diagnosis not present

## 2019-05-25 DIAGNOSIS — E119 Type 2 diabetes mellitus without complications: Secondary | ICD-10-CM | POA: Diagnosis not present

## 2019-05-25 DIAGNOSIS — I10 Essential (primary) hypertension: Secondary | ICD-10-CM | POA: Diagnosis not present

## 2019-05-31 DIAGNOSIS — M25562 Pain in left knee: Secondary | ICD-10-CM | POA: Diagnosis not present

## 2019-06-10 DIAGNOSIS — N390 Urinary tract infection, site not specified: Secondary | ICD-10-CM | POA: Diagnosis not present

## 2019-06-23 DIAGNOSIS — E7849 Other hyperlipidemia: Secondary | ICD-10-CM | POA: Diagnosis not present

## 2019-06-23 DIAGNOSIS — J158 Pneumonia due to other specified bacteria: Secondary | ICD-10-CM | POA: Diagnosis not present

## 2019-06-23 DIAGNOSIS — I10 Essential (primary) hypertension: Secondary | ICD-10-CM | POA: Diagnosis not present

## 2019-06-23 DIAGNOSIS — R7303 Prediabetes: Secondary | ICD-10-CM | POA: Diagnosis not present

## 2019-06-23 DIAGNOSIS — E119 Type 2 diabetes mellitus without complications: Secondary | ICD-10-CM | POA: Diagnosis not present

## 2019-06-23 DIAGNOSIS — Z0000 Encounter for general adult medical examination without abnormal findings: Secondary | ICD-10-CM | POA: Diagnosis not present

## 2019-07-05 DIAGNOSIS — E119 Type 2 diabetes mellitus without complications: Secondary | ICD-10-CM | POA: Diagnosis not present

## 2019-07-05 DIAGNOSIS — I10 Essential (primary) hypertension: Secondary | ICD-10-CM | POA: Diagnosis not present

## 2019-07-05 DIAGNOSIS — E7849 Other hyperlipidemia: Secondary | ICD-10-CM | POA: Diagnosis not present

## 2019-08-16 DIAGNOSIS — I10 Essential (primary) hypertension: Secondary | ICD-10-CM | POA: Diagnosis not present

## 2019-08-16 DIAGNOSIS — E119 Type 2 diabetes mellitus without complications: Secondary | ICD-10-CM | POA: Diagnosis not present

## 2019-08-16 DIAGNOSIS — E7849 Other hyperlipidemia: Secondary | ICD-10-CM | POA: Diagnosis not present

## 2019-09-20 DIAGNOSIS — G5603 Carpal tunnel syndrome, bilateral upper limbs: Secondary | ICD-10-CM | POA: Diagnosis not present

## 2019-09-20 DIAGNOSIS — M25562 Pain in left knee: Secondary | ICD-10-CM | POA: Diagnosis not present

## 2019-09-29 DIAGNOSIS — I10 Essential (primary) hypertension: Secondary | ICD-10-CM | POA: Diagnosis not present

## 2019-09-29 DIAGNOSIS — E119 Type 2 diabetes mellitus without complications: Secondary | ICD-10-CM | POA: Diagnosis not present

## 2019-09-29 DIAGNOSIS — E7849 Other hyperlipidemia: Secondary | ICD-10-CM | POA: Diagnosis not present

## 2019-10-06 DIAGNOSIS — G5603 Carpal tunnel syndrome, bilateral upper limbs: Secondary | ICD-10-CM | POA: Diagnosis not present

## 2019-10-10 DIAGNOSIS — E119 Type 2 diabetes mellitus without complications: Secondary | ICD-10-CM | POA: Diagnosis not present

## 2019-10-10 DIAGNOSIS — E7849 Other hyperlipidemia: Secondary | ICD-10-CM | POA: Diagnosis not present

## 2019-10-10 DIAGNOSIS — I10 Essential (primary) hypertension: Secondary | ICD-10-CM | POA: Diagnosis not present

## 2019-10-25 DIAGNOSIS — G5601 Carpal tunnel syndrome, right upper limb: Secondary | ICD-10-CM | POA: Diagnosis not present

## 2019-11-04 DIAGNOSIS — G5601 Carpal tunnel syndrome, right upper limb: Secondary | ICD-10-CM | POA: Diagnosis not present

## 2019-11-21 DIAGNOSIS — M545 Low back pain: Secondary | ICD-10-CM | POA: Diagnosis not present

## 2019-12-05 DIAGNOSIS — M545 Low back pain: Secondary | ICD-10-CM | POA: Diagnosis not present

## 2019-12-05 DIAGNOSIS — M25551 Pain in right hip: Secondary | ICD-10-CM | POA: Diagnosis not present

## 2019-12-05 DIAGNOSIS — M5431 Sciatica, right side: Secondary | ICD-10-CM | POA: Diagnosis not present

## 2019-12-20 DIAGNOSIS — M5431 Sciatica, right side: Secondary | ICD-10-CM | POA: Diagnosis not present

## 2019-12-20 DIAGNOSIS — M545 Low back pain, unspecified: Secondary | ICD-10-CM | POA: Diagnosis not present

## 2019-12-20 DIAGNOSIS — M25551 Pain in right hip: Secondary | ICD-10-CM | POA: Diagnosis not present

## 2019-12-26 DIAGNOSIS — I10 Essential (primary) hypertension: Secondary | ICD-10-CM | POA: Diagnosis not present

## 2019-12-26 DIAGNOSIS — E119 Type 2 diabetes mellitus without complications: Secondary | ICD-10-CM | POA: Diagnosis not present

## 2019-12-26 DIAGNOSIS — F3341 Major depressive disorder, recurrent, in partial remission: Secondary | ICD-10-CM | POA: Diagnosis not present

## 2019-12-26 DIAGNOSIS — E7849 Other hyperlipidemia: Secondary | ICD-10-CM | POA: Diagnosis not present

## 2020-01-07 DIAGNOSIS — M545 Low back pain, unspecified: Secondary | ICD-10-CM | POA: Diagnosis not present

## 2020-01-13 DIAGNOSIS — M5416 Radiculopathy, lumbar region: Secondary | ICD-10-CM | POA: Diagnosis not present

## 2020-01-23 DIAGNOSIS — I10 Essential (primary) hypertension: Secondary | ICD-10-CM | POA: Diagnosis not present

## 2020-01-23 DIAGNOSIS — F3341 Major depressive disorder, recurrent, in partial remission: Secondary | ICD-10-CM | POA: Diagnosis not present

## 2020-01-23 DIAGNOSIS — E7849 Other hyperlipidemia: Secondary | ICD-10-CM | POA: Diagnosis not present

## 2020-01-23 DIAGNOSIS — E119 Type 2 diabetes mellitus without complications: Secondary | ICD-10-CM | POA: Diagnosis not present

## 2020-02-01 DIAGNOSIS — M5136 Other intervertebral disc degeneration, lumbar region: Secondary | ICD-10-CM | POA: Diagnosis not present

## 2020-02-08 DIAGNOSIS — E1143 Type 2 diabetes mellitus with diabetic autonomic (poly)neuropathy: Secondary | ICD-10-CM | POA: Diagnosis not present

## 2020-02-08 DIAGNOSIS — E7849 Other hyperlipidemia: Secondary | ICD-10-CM | POA: Diagnosis not present

## 2020-02-08 DIAGNOSIS — I10 Essential (primary) hypertension: Secondary | ICD-10-CM | POA: Diagnosis not present

## 2020-02-08 DIAGNOSIS — M5431 Sciatica, right side: Secondary | ICD-10-CM | POA: Diagnosis not present

## 2020-02-08 DIAGNOSIS — Z6826 Body mass index (BMI) 26.0-26.9, adult: Secondary | ICD-10-CM | POA: Diagnosis not present

## 2020-02-09 DIAGNOSIS — E7849 Other hyperlipidemia: Secondary | ICD-10-CM | POA: Diagnosis not present

## 2020-02-09 DIAGNOSIS — M5431 Sciatica, right side: Secondary | ICD-10-CM | POA: Diagnosis not present

## 2020-02-09 DIAGNOSIS — E1143 Type 2 diabetes mellitus with diabetic autonomic (poly)neuropathy: Secondary | ICD-10-CM | POA: Diagnosis not present

## 2020-02-09 DIAGNOSIS — I10 Essential (primary) hypertension: Secondary | ICD-10-CM | POA: Diagnosis not present

## 2020-02-23 DIAGNOSIS — M5416 Radiculopathy, lumbar region: Secondary | ICD-10-CM | POA: Diagnosis not present

## 2020-02-27 DIAGNOSIS — E7849 Other hyperlipidemia: Secondary | ICD-10-CM | POA: Diagnosis not present

## 2020-02-27 DIAGNOSIS — E1143 Type 2 diabetes mellitus with diabetic autonomic (poly)neuropathy: Secondary | ICD-10-CM | POA: Diagnosis not present

## 2020-02-27 DIAGNOSIS — I10 Essential (primary) hypertension: Secondary | ICD-10-CM | POA: Diagnosis not present

## 2020-03-08 DIAGNOSIS — M5416 Radiculopathy, lumbar region: Secondary | ICD-10-CM | POA: Diagnosis not present

## 2020-04-19 DIAGNOSIS — H524 Presbyopia: Secondary | ICD-10-CM | POA: Diagnosis not present

## 2020-04-19 DIAGNOSIS — Z0100 Encounter for examination of eyes and vision without abnormal findings: Secondary | ICD-10-CM | POA: Diagnosis not present

## 2020-04-19 DIAGNOSIS — E113393 Type 2 diabetes mellitus with moderate nonproliferative diabetic retinopathy without macular edema, bilateral: Secondary | ICD-10-CM | POA: Diagnosis not present

## 2020-05-08 DIAGNOSIS — M25562 Pain in left knee: Secondary | ICD-10-CM | POA: Diagnosis not present

## 2020-05-09 DIAGNOSIS — Z6826 Body mass index (BMI) 26.0-26.9, adult: Secondary | ICD-10-CM | POA: Diagnosis not present

## 2020-05-09 DIAGNOSIS — I10 Essential (primary) hypertension: Secondary | ICD-10-CM | POA: Diagnosis not present

## 2020-05-09 DIAGNOSIS — Z0000 Encounter for general adult medical examination without abnormal findings: Secondary | ICD-10-CM | POA: Diagnosis not present

## 2020-05-09 DIAGNOSIS — E119 Type 2 diabetes mellitus without complications: Secondary | ICD-10-CM | POA: Diagnosis not present

## 2020-05-14 DIAGNOSIS — Z1211 Encounter for screening for malignant neoplasm of colon: Secondary | ICD-10-CM | POA: Diagnosis not present

## 2020-05-21 DIAGNOSIS — M461 Sacroiliitis, not elsewhere classified: Secondary | ICD-10-CM | POA: Diagnosis not present

## 2020-05-21 DIAGNOSIS — M5451 Vertebrogenic low back pain: Secondary | ICD-10-CM | POA: Diagnosis not present

## 2020-06-14 DIAGNOSIS — M47816 Spondylosis without myelopathy or radiculopathy, lumbar region: Secondary | ICD-10-CM | POA: Diagnosis not present

## 2020-06-21 DIAGNOSIS — G894 Chronic pain syndrome: Secondary | ICD-10-CM | POA: Diagnosis not present

## 2020-07-16 DIAGNOSIS — M5441 Lumbago with sciatica, right side: Secondary | ICD-10-CM | POA: Diagnosis not present

## 2020-07-16 DIAGNOSIS — M47816 Spondylosis without myelopathy or radiculopathy, lumbar region: Secondary | ICD-10-CM | POA: Diagnosis not present

## 2020-07-16 DIAGNOSIS — M9903 Segmental and somatic dysfunction of lumbar region: Secondary | ICD-10-CM | POA: Diagnosis not present

## 2020-07-19 DIAGNOSIS — M5441 Lumbago with sciatica, right side: Secondary | ICD-10-CM | POA: Diagnosis not present

## 2020-07-19 DIAGNOSIS — M9903 Segmental and somatic dysfunction of lumbar region: Secondary | ICD-10-CM | POA: Diagnosis not present

## 2020-07-19 DIAGNOSIS — M47816 Spondylosis without myelopathy or radiculopathy, lumbar region: Secondary | ICD-10-CM | POA: Diagnosis not present

## 2020-07-20 DIAGNOSIS — K838 Other specified diseases of biliary tract: Secondary | ICD-10-CM | POA: Diagnosis not present

## 2020-07-20 DIAGNOSIS — J986 Disorders of diaphragm: Secondary | ICD-10-CM | POA: Diagnosis not present

## 2020-07-20 DIAGNOSIS — R778 Other specified abnormalities of plasma proteins: Secondary | ICD-10-CM | POA: Diagnosis not present

## 2020-07-20 DIAGNOSIS — N390 Urinary tract infection, site not specified: Secondary | ICD-10-CM | POA: Diagnosis not present

## 2020-07-20 DIAGNOSIS — Z7901 Long term (current) use of anticoagulants: Secondary | ICD-10-CM | POA: Diagnosis not present

## 2020-07-20 DIAGNOSIS — R319 Hematuria, unspecified: Secondary | ICD-10-CM | POA: Diagnosis not present

## 2020-07-20 DIAGNOSIS — Z20822 Contact with and (suspected) exposure to covid-19: Secondary | ICD-10-CM | POA: Diagnosis not present

## 2020-07-20 DIAGNOSIS — J90 Pleural effusion, not elsewhere classified: Secondary | ICD-10-CM | POA: Diagnosis not present

## 2020-07-20 DIAGNOSIS — I081 Rheumatic disorders of both mitral and tricuspid valves: Secondary | ICD-10-CM | POA: Diagnosis not present

## 2020-07-20 DIAGNOSIS — G8929 Other chronic pain: Secondary | ICD-10-CM | POA: Diagnosis not present

## 2020-07-20 DIAGNOSIS — I272 Pulmonary hypertension, unspecified: Secondary | ICD-10-CM | POA: Diagnosis not present

## 2020-07-20 DIAGNOSIS — R109 Unspecified abdominal pain: Secondary | ICD-10-CM | POA: Diagnosis not present

## 2020-07-20 DIAGNOSIS — R7989 Other specified abnormal findings of blood chemistry: Secondary | ICD-10-CM | POA: Diagnosis not present

## 2020-07-20 DIAGNOSIS — I700 Atherosclerosis of aorta: Secondary | ICD-10-CM | POA: Diagnosis not present

## 2020-07-20 DIAGNOSIS — B348 Other viral infections of unspecified site: Secondary | ICD-10-CM | POA: Diagnosis not present

## 2020-07-20 DIAGNOSIS — R1111 Vomiting without nausea: Secondary | ICD-10-CM | POA: Diagnosis not present

## 2020-07-20 DIAGNOSIS — B9620 Unspecified Escherichia coli [E. coli] as the cause of diseases classified elsewhere: Secondary | ICD-10-CM | POA: Diagnosis not present

## 2020-07-20 DIAGNOSIS — E1165 Type 2 diabetes mellitus with hyperglycemia: Secondary | ICD-10-CM | POA: Diagnosis not present

## 2020-07-20 DIAGNOSIS — G894 Chronic pain syndrome: Secondary | ICD-10-CM | POA: Diagnosis not present

## 2020-07-20 DIAGNOSIS — I5023 Acute on chronic systolic (congestive) heart failure: Secondary | ICD-10-CM | POA: Diagnosis not present

## 2020-07-20 DIAGNOSIS — M4317 Spondylolisthesis, lumbosacral region: Secondary | ICD-10-CM | POA: Diagnosis not present

## 2020-07-20 DIAGNOSIS — E119 Type 2 diabetes mellitus without complications: Secondary | ICD-10-CM | POA: Diagnosis not present

## 2020-07-20 DIAGNOSIS — R059 Cough, unspecified: Secondary | ICD-10-CM | POA: Diagnosis not present

## 2020-07-20 DIAGNOSIS — Z79899 Other long term (current) drug therapy: Secondary | ICD-10-CM | POA: Diagnosis not present

## 2020-07-20 DIAGNOSIS — J9811 Atelectasis: Secondary | ICD-10-CM | POA: Diagnosis not present

## 2020-07-20 DIAGNOSIS — I4891 Unspecified atrial fibrillation: Secondary | ICD-10-CM | POA: Diagnosis not present

## 2020-07-20 DIAGNOSIS — R6883 Chills (without fever): Secondary | ICD-10-CM | POA: Diagnosis not present

## 2020-07-20 DIAGNOSIS — Z9981 Dependence on supplemental oxygen: Secondary | ICD-10-CM | POA: Diagnosis not present

## 2020-07-20 DIAGNOSIS — Z792 Long term (current) use of antibiotics: Secondary | ICD-10-CM | POA: Diagnosis not present

## 2020-07-20 DIAGNOSIS — Z7984 Long term (current) use of oral hypoglycemic drugs: Secondary | ICD-10-CM | POA: Diagnosis not present

## 2020-07-20 DIAGNOSIS — R0902 Hypoxemia: Secondary | ICD-10-CM | POA: Diagnosis not present

## 2020-07-20 DIAGNOSIS — I509 Heart failure, unspecified: Secondary | ICD-10-CM | POA: Diagnosis not present

## 2020-07-20 DIAGNOSIS — Z1629 Resistance to other single specified antibiotic: Secondary | ICD-10-CM | POA: Diagnosis not present

## 2020-07-20 DIAGNOSIS — I482 Chronic atrial fibrillation, unspecified: Secondary | ICD-10-CM | POA: Diagnosis not present

## 2020-07-20 DIAGNOSIS — J811 Chronic pulmonary edema: Secondary | ICD-10-CM | POA: Diagnosis not present

## 2020-07-21 DIAGNOSIS — E1165 Type 2 diabetes mellitus with hyperglycemia: Secondary | ICD-10-CM | POA: Diagnosis not present

## 2020-07-21 DIAGNOSIS — N390 Urinary tract infection, site not specified: Secondary | ICD-10-CM | POA: Diagnosis not present

## 2020-07-21 DIAGNOSIS — Z79899 Other long term (current) drug therapy: Secondary | ICD-10-CM | POA: Diagnosis not present

## 2020-07-21 DIAGNOSIS — B9620 Unspecified Escherichia coli [E. coli] as the cause of diseases classified elsewhere: Secondary | ICD-10-CM | POA: Diagnosis not present

## 2020-07-21 DIAGNOSIS — Z792 Long term (current) use of antibiotics: Secondary | ICD-10-CM | POA: Diagnosis not present

## 2020-07-21 DIAGNOSIS — Z7901 Long term (current) use of anticoagulants: Secondary | ICD-10-CM | POA: Diagnosis not present

## 2020-07-21 DIAGNOSIS — R778 Other specified abnormalities of plasma proteins: Secondary | ICD-10-CM | POA: Diagnosis not present

## 2020-07-21 DIAGNOSIS — I482 Chronic atrial fibrillation, unspecified: Secondary | ICD-10-CM | POA: Diagnosis not present

## 2020-07-21 DIAGNOSIS — G8929 Other chronic pain: Secondary | ICD-10-CM | POA: Diagnosis not present

## 2020-07-22 DIAGNOSIS — N390 Urinary tract infection, site not specified: Secondary | ICD-10-CM | POA: Diagnosis not present

## 2020-07-22 DIAGNOSIS — I482 Chronic atrial fibrillation, unspecified: Secondary | ICD-10-CM | POA: Diagnosis not present

## 2020-07-22 DIAGNOSIS — E119 Type 2 diabetes mellitus without complications: Secondary | ICD-10-CM | POA: Diagnosis not present

## 2020-07-23 DIAGNOSIS — R059 Cough, unspecified: Secondary | ICD-10-CM | POA: Diagnosis not present

## 2020-07-23 DIAGNOSIS — E119 Type 2 diabetes mellitus without complications: Secondary | ICD-10-CM | POA: Diagnosis not present

## 2020-07-23 DIAGNOSIS — I4891 Unspecified atrial fibrillation: Secondary | ICD-10-CM | POA: Diagnosis not present

## 2020-07-23 DIAGNOSIS — I272 Pulmonary hypertension, unspecified: Secondary | ICD-10-CM | POA: Diagnosis not present

## 2020-07-23 DIAGNOSIS — I482 Chronic atrial fibrillation, unspecified: Secondary | ICD-10-CM | POA: Diagnosis not present

## 2020-07-23 DIAGNOSIS — R0902 Hypoxemia: Secondary | ICD-10-CM | POA: Diagnosis not present

## 2020-07-23 DIAGNOSIS — J90 Pleural effusion, not elsewhere classified: Secondary | ICD-10-CM | POA: Diagnosis not present

## 2020-07-23 DIAGNOSIS — J811 Chronic pulmonary edema: Secondary | ICD-10-CM | POA: Diagnosis not present

## 2020-07-23 DIAGNOSIS — Z7901 Long term (current) use of anticoagulants: Secondary | ICD-10-CM | POA: Diagnosis not present

## 2020-07-23 DIAGNOSIS — Z792 Long term (current) use of antibiotics: Secondary | ICD-10-CM | POA: Diagnosis not present

## 2020-07-23 DIAGNOSIS — I081 Rheumatic disorders of both mitral and tricuspid valves: Secondary | ICD-10-CM | POA: Diagnosis not present

## 2020-07-23 DIAGNOSIS — R6883 Chills (without fever): Secondary | ICD-10-CM | POA: Diagnosis not present

## 2020-07-23 DIAGNOSIS — R778 Other specified abnormalities of plasma proteins: Secondary | ICD-10-CM | POA: Diagnosis not present

## 2020-07-23 DIAGNOSIS — N390 Urinary tract infection, site not specified: Secondary | ICD-10-CM | POA: Diagnosis not present

## 2020-07-24 DIAGNOSIS — N390 Urinary tract infection, site not specified: Secondary | ICD-10-CM | POA: Diagnosis not present

## 2020-07-24 DIAGNOSIS — I482 Chronic atrial fibrillation, unspecified: Secondary | ICD-10-CM | POA: Diagnosis not present

## 2020-07-24 DIAGNOSIS — B348 Other viral infections of unspecified site: Secondary | ICD-10-CM | POA: Diagnosis not present

## 2020-07-24 DIAGNOSIS — Z7901 Long term (current) use of anticoagulants: Secondary | ICD-10-CM | POA: Diagnosis not present

## 2020-07-24 DIAGNOSIS — J90 Pleural effusion, not elsewhere classified: Secondary | ICD-10-CM | POA: Diagnosis not present

## 2020-07-24 DIAGNOSIS — R0902 Hypoxemia: Secondary | ICD-10-CM | POA: Diagnosis not present

## 2020-07-24 DIAGNOSIS — E119 Type 2 diabetes mellitus without complications: Secondary | ICD-10-CM | POA: Diagnosis not present

## 2020-07-24 DIAGNOSIS — R778 Other specified abnormalities of plasma proteins: Secondary | ICD-10-CM | POA: Diagnosis not present

## 2020-07-24 DIAGNOSIS — J9811 Atelectasis: Secondary | ICD-10-CM | POA: Diagnosis not present

## 2020-07-24 DIAGNOSIS — B9620 Unspecified Escherichia coli [E. coli] as the cause of diseases classified elsewhere: Secondary | ICD-10-CM | POA: Diagnosis not present

## 2020-07-24 DIAGNOSIS — Z1629 Resistance to other single specified antibiotic: Secondary | ICD-10-CM | POA: Diagnosis not present

## 2020-07-25 DIAGNOSIS — R778 Other specified abnormalities of plasma proteins: Secondary | ICD-10-CM | POA: Diagnosis not present

## 2020-07-25 DIAGNOSIS — I482 Chronic atrial fibrillation, unspecified: Secondary | ICD-10-CM | POA: Diagnosis not present

## 2020-07-25 DIAGNOSIS — Z9981 Dependence on supplemental oxygen: Secondary | ICD-10-CM | POA: Diagnosis not present

## 2020-07-25 DIAGNOSIS — B348 Other viral infections of unspecified site: Secondary | ICD-10-CM | POA: Diagnosis not present

## 2020-07-25 DIAGNOSIS — N390 Urinary tract infection, site not specified: Secondary | ICD-10-CM | POA: Diagnosis not present

## 2020-07-25 DIAGNOSIS — E119 Type 2 diabetes mellitus without complications: Secondary | ICD-10-CM | POA: Diagnosis not present

## 2020-07-25 DIAGNOSIS — I4891 Unspecified atrial fibrillation: Secondary | ICD-10-CM | POA: Diagnosis not present

## 2020-07-25 DIAGNOSIS — I509 Heart failure, unspecified: Secondary | ICD-10-CM | POA: Diagnosis not present

## 2020-07-25 DIAGNOSIS — I700 Atherosclerosis of aorta: Secondary | ICD-10-CM | POA: Diagnosis not present

## 2020-07-25 DIAGNOSIS — R0902 Hypoxemia: Secondary | ICD-10-CM | POA: Diagnosis not present

## 2020-07-26 DIAGNOSIS — E785 Hyperlipidemia, unspecified: Secondary | ICD-10-CM | POA: Diagnosis not present

## 2020-07-26 DIAGNOSIS — E119 Type 2 diabetes mellitus without complications: Secondary | ICD-10-CM | POA: Diagnosis not present

## 2020-07-26 DIAGNOSIS — I482 Chronic atrial fibrillation, unspecified: Secondary | ICD-10-CM | POA: Diagnosis not present

## 2020-07-26 DIAGNOSIS — I509 Heart failure, unspecified: Secondary | ICD-10-CM | POA: Diagnosis not present

## 2020-07-26 DIAGNOSIS — R778 Other specified abnormalities of plasma proteins: Secondary | ICD-10-CM | POA: Diagnosis not present

## 2020-08-09 DIAGNOSIS — E1169 Type 2 diabetes mellitus with other specified complication: Secondary | ICD-10-CM | POA: Diagnosis not present

## 2020-08-09 DIAGNOSIS — Z6825 Body mass index (BMI) 25.0-25.9, adult: Secondary | ICD-10-CM | POA: Diagnosis not present

## 2020-08-09 DIAGNOSIS — F32A Depression, unspecified: Secondary | ICD-10-CM | POA: Diagnosis not present

## 2020-08-09 DIAGNOSIS — I509 Heart failure, unspecified: Secondary | ICD-10-CM | POA: Diagnosis not present

## 2020-08-09 DIAGNOSIS — I480 Paroxysmal atrial fibrillation: Secondary | ICD-10-CM | POA: Diagnosis not present

## 2020-08-14 DIAGNOSIS — R7989 Other specified abnormal findings of blood chemistry: Secondary | ICD-10-CM | POA: Diagnosis not present

## 2020-08-14 DIAGNOSIS — R778 Other specified abnormalities of plasma proteins: Secondary | ICD-10-CM | POA: Diagnosis not present

## 2020-08-14 DIAGNOSIS — I509 Heart failure, unspecified: Secondary | ICD-10-CM | POA: Diagnosis not present

## 2020-08-14 DIAGNOSIS — I482 Chronic atrial fibrillation, unspecified: Secondary | ICD-10-CM | POA: Diagnosis not present

## 2020-08-14 DIAGNOSIS — R9431 Abnormal electrocardiogram [ECG] [EKG]: Secondary | ICD-10-CM | POA: Diagnosis not present

## 2020-08-30 DIAGNOSIS — I509 Heart failure, unspecified: Secondary | ICD-10-CM | POA: Diagnosis not present

## 2020-08-30 DIAGNOSIS — I482 Chronic atrial fibrillation, unspecified: Secondary | ICD-10-CM | POA: Diagnosis not present

## 2020-08-30 DIAGNOSIS — E785 Hyperlipidemia, unspecified: Secondary | ICD-10-CM | POA: Diagnosis not present

## 2020-09-11 DIAGNOSIS — E113393 Type 2 diabetes mellitus with moderate nonproliferative diabetic retinopathy without macular edema, bilateral: Secondary | ICD-10-CM | POA: Diagnosis not present

## 2020-09-19 DIAGNOSIS — M1712 Unilateral primary osteoarthritis, left knee: Secondary | ICD-10-CM | POA: Diagnosis not present

## 2020-09-25 DIAGNOSIS — M1712 Unilateral primary osteoarthritis, left knee: Secondary | ICD-10-CM | POA: Diagnosis not present

## 2020-10-02 DIAGNOSIS — M1712 Unilateral primary osteoarthritis, left knee: Secondary | ICD-10-CM | POA: Diagnosis not present

## 2020-10-02 DIAGNOSIS — M179 Osteoarthritis of knee, unspecified: Secondary | ICD-10-CM | POA: Diagnosis not present

## 2020-10-30 ENCOUNTER — Other Ambulatory Visit (HOSPITAL_COMMUNITY): Payer: Self-pay | Admitting: Sports Medicine

## 2020-10-30 ENCOUNTER — Other Ambulatory Visit: Payer: Self-pay

## 2020-10-30 ENCOUNTER — Ambulatory Visit (HOSPITAL_COMMUNITY)
Admission: RE | Admit: 2020-10-30 | Discharge: 2020-10-30 | Disposition: A | Discharge status: Home / Self Care | Payer: Medicare HMO | Source: Ambulatory Visit | Attending: Sports Medicine | Admitting: Sports Medicine

## 2020-10-30 DIAGNOSIS — M1712 Unilateral primary osteoarthritis, left knee: Secondary | ICD-10-CM | POA: Diagnosis not present

## 2020-10-30 DIAGNOSIS — M79605 Pain in left leg: Secondary | ICD-10-CM

## 2020-10-30 DIAGNOSIS — M7989 Other specified soft tissue disorders: Secondary | ICD-10-CM | POA: Insufficient documentation

## 2020-10-30 DIAGNOSIS — M79662 Pain in left lower leg: Secondary | ICD-10-CM | POA: Diagnosis not present

## 2020-10-30 NOTE — Progress Notes (Signed)
Lower extremity venous has been completed.   Preliminary results in CV Proc.   Abram Sander 10/30/2020 3:08 PM

## 2020-11-05 DIAGNOSIS — F32A Depression, unspecified: Secondary | ICD-10-CM | POA: Diagnosis not present

## 2020-11-05 DIAGNOSIS — Z6826 Body mass index (BMI) 26.0-26.9, adult: Secondary | ICD-10-CM | POA: Diagnosis not present

## 2020-11-05 DIAGNOSIS — I5022 Chronic systolic (congestive) heart failure: Secondary | ICD-10-CM | POA: Diagnosis not present

## 2020-11-05 DIAGNOSIS — L0390 Cellulitis, unspecified: Secondary | ICD-10-CM | POA: Diagnosis not present

## 2020-11-05 DIAGNOSIS — E1169 Type 2 diabetes mellitus with other specified complication: Secondary | ICD-10-CM | POA: Diagnosis not present

## 2020-11-05 DIAGNOSIS — I480 Paroxysmal atrial fibrillation: Secondary | ICD-10-CM | POA: Diagnosis not present

## 2020-11-15 DIAGNOSIS — M1712 Unilateral primary osteoarthritis, left knee: Secondary | ICD-10-CM | POA: Diagnosis not present

## 2020-11-29 DIAGNOSIS — I482 Chronic atrial fibrillation, unspecified: Secondary | ICD-10-CM | POA: Diagnosis not present

## 2020-11-29 DIAGNOSIS — I5042 Chronic combined systolic (congestive) and diastolic (congestive) heart failure: Secondary | ICD-10-CM | POA: Diagnosis not present

## 2020-11-29 DIAGNOSIS — E785 Hyperlipidemia, unspecified: Secondary | ICD-10-CM | POA: Diagnosis not present

## 2020-12-03 DIAGNOSIS — I471 Supraventricular tachycardia: Secondary | ICD-10-CM | POA: Diagnosis not present

## 2020-12-03 DIAGNOSIS — Z6828 Body mass index (BMI) 28.0-28.9, adult: Secondary | ICD-10-CM | POA: Diagnosis not present

## 2020-12-03 DIAGNOSIS — L0390 Cellulitis, unspecified: Secondary | ICD-10-CM | POA: Diagnosis not present

## 2020-12-05 DIAGNOSIS — I471 Supraventricular tachycardia: Secondary | ICD-10-CM | POA: Diagnosis not present

## 2020-12-05 DIAGNOSIS — D649 Anemia, unspecified: Secondary | ICD-10-CM | POA: Diagnosis not present

## 2020-12-06 ENCOUNTER — Emergency Department (HOSPITAL_COMMUNITY): Payer: Medicare HMO

## 2020-12-06 ENCOUNTER — Emergency Department (HOSPITAL_COMMUNITY)
Admission: EM | Admit: 2020-12-06 | Discharge: 2020-12-06 | Disposition: A | Discharge status: Home / Self Care | Payer: Medicare HMO | Attending: Emergency Medicine | Admitting: Emergency Medicine

## 2020-12-06 ENCOUNTER — Encounter (HOSPITAL_COMMUNITY): Payer: Self-pay | Admitting: *Deleted

## 2020-12-06 ENCOUNTER — Other Ambulatory Visit: Payer: Self-pay

## 2020-12-06 DIAGNOSIS — Z28310 Unvaccinated for covid-19: Secondary | ICD-10-CM | POA: Diagnosis not present

## 2020-12-06 DIAGNOSIS — J810 Acute pulmonary edema: Secondary | ICD-10-CM | POA: Insufficient documentation

## 2020-12-06 DIAGNOSIS — I517 Cardiomegaly: Secondary | ICD-10-CM | POA: Diagnosis not present

## 2020-12-06 DIAGNOSIS — I2510 Atherosclerotic heart disease of native coronary artery without angina pectoris: Secondary | ICD-10-CM | POA: Diagnosis not present

## 2020-12-06 DIAGNOSIS — Z96611 Presence of right artificial shoulder joint: Secondary | ICD-10-CM | POA: Diagnosis not present

## 2020-12-06 DIAGNOSIS — I480 Paroxysmal atrial fibrillation: Secondary | ICD-10-CM | POA: Diagnosis not present

## 2020-12-06 DIAGNOSIS — E119 Type 2 diabetes mellitus without complications: Secondary | ICD-10-CM | POA: Diagnosis not present

## 2020-12-06 DIAGNOSIS — Z79899 Other long term (current) drug therapy: Secondary | ICD-10-CM | POA: Diagnosis not present

## 2020-12-06 DIAGNOSIS — Z20822 Contact with and (suspected) exposure to covid-19: Secondary | ICD-10-CM | POA: Insufficient documentation

## 2020-12-06 DIAGNOSIS — Z7901 Long term (current) use of anticoagulants: Secondary | ICD-10-CM | POA: Insufficient documentation

## 2020-12-06 DIAGNOSIS — I10 Essential (primary) hypertension: Secondary | ICD-10-CM | POA: Insufficient documentation

## 2020-12-06 DIAGNOSIS — Z7984 Long term (current) use of oral hypoglycemic drugs: Secondary | ICD-10-CM | POA: Insufficient documentation

## 2020-12-06 DIAGNOSIS — R0602 Shortness of breath: Secondary | ICD-10-CM | POA: Diagnosis not present

## 2020-12-06 HISTORY — DX: Unspecified atrial fibrillation: I48.91

## 2020-12-06 LAB — BASIC METABOLIC PANEL
Anion gap: 7 (ref 5–15)
BUN: 13 mg/dL (ref 8–23)
CO2: 24 mmol/L (ref 22–32)
Calcium: 8.1 mg/dL — ABNORMAL LOW (ref 8.9–10.3)
Chloride: 109 mmol/L (ref 98–111)
Creatinine, Ser: 0.92 mg/dL (ref 0.44–1.00)
GFR, Estimated: >60 mL/min (ref >60)
Glucose, Bld: 181 mg/dL — ABNORMAL HIGH (ref 70–99)
Potassium: 3.9 mmol/L (ref 3.5–5.1)
Sodium: 140 mmol/L (ref 135–145)

## 2020-12-06 LAB — RESP PANEL BY RT-PCR (FLU A&B, COVID) ARPGX2
Influenza A by PCR: NEGATIVE
Influenza B by PCR: NEGATIVE
SARS Coronavirus 2 by RT PCR: NEGATIVE

## 2020-12-06 LAB — CBC WITH DIFFERENTIAL/PLATELET
Abs Immature Granulocytes: 0.02 10*3/uL (ref 0.00–0.07)
Basophils Absolute: 0.1 10*3/uL (ref 0.0–0.1)
Basophils Relative: 1 %
Eosinophils Absolute: 0.1 10*3/uL (ref 0.0–0.5)
Eosinophils Relative: 2 %
HCT: 42.2 % (ref 36.0–46.0)
Hemoglobin: 13.4 g/dL (ref 12.0–15.0)
Immature Granulocytes: 0 %
Lymphocytes Relative: 32 %
Lymphs Abs: 2.6 10*3/uL (ref 0.7–4.0)
MCH: 26.3 pg (ref 26.0–34.0)
MCHC: 31.8 g/dL (ref 30.0–36.0)
MCV: 82.7 fL (ref 80.0–100.0)
Monocytes Absolute: 0.7 10*3/uL (ref 0.1–1.0)
Monocytes Relative: 9 %
Neutro Abs: 4.6 10*3/uL (ref 1.7–7.7)
Neutrophils Relative %: 56 %
Platelets: 182 10*3/uL (ref 150–400)
RBC: 5.1 MIL/uL (ref 3.87–5.11)
RDW: 15.7 % — ABNORMAL HIGH (ref 11.5–15.5)
WBC: 8.1 10*3/uL (ref 4.0–10.5)
nRBC: 0 % (ref 0.0–0.2)

## 2020-12-06 LAB — BRAIN NATRIURETIC PEPTIDE: B Natriuretic Peptide: 387 pg/mL — ABNORMAL HIGH (ref 0.0–100.0)

## 2020-12-06 MED ORDER — FUROSEMIDE 10 MG/ML IJ SOLN
20.0000 mg | Freq: Once | INTRAMUSCULAR | Status: AC
  Administered 2020-12-06: 20 mg via INTRAVENOUS
  Filled 2020-12-06: qty 2

## 2020-12-06 NOTE — Discharge Instructions (Addendum)
Continue current medications. 

## 2020-12-06 NOTE — ED Provider Notes (Signed)
[facility] EMERGENCY DEPARTMENT Provider Note   CSN: [id number] Arrival date & time: 12/06/20  1031     History Chief Complaint  Patient presents with   Shortness of Breath    Jill Fisher is a 86 y.o. female.  Pt presents to the ED today with sob.  She has had sx for a few days.  She did have some rectal bleeding for 2 days, but that has stopped.  She is on Eliquis for a hx of afib.  She did see her doctor yesterday and had some blood work done, but results are not back.  Pt has also had a cold.  She has not been Covid vaccinated.  No known Covid exposures.      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   Dizziness and giddiness 03/18/2012   Disturbance of skin sensation 03/18/2012   GERD (gastroesophageal reflux disease)    Cerebrovascular disease    PSVT 06/01/2009   PALPITATIONS 05/15/2009   CAROTID ARTERY DISEASE 03/08/2009   HYPERLIPIDEMIA-MIXED 12/21/2008   CAD, NATIVE VESSEL 12/21/2008   CHEST PAIN-UNSPECIFIED 12/21/2008    Past Surgical History:  Procedure Laterality Date   APPENDECTOMY     CARDIAC CATHETERIZATION     CAROTID ENDARTERECTOMY     left   CAROTID STENT INSERTION Left    CATARACT EXTRACTION W/PHACO Right 11/30/2012   Procedure: CATARACT EXTRACTION PHACO AND INTRAOCULAR LENS PLACEMENT ([place]);  Surgeon: Elta Guadeloupe [name], MD;  Location: [facility];  Service: Ophthalmology;  Laterality: Right;  CDE:12.71   CATARACT EXTRACTION W/PHACO Left 12/21/2012   Procedure: CATARACT EXTRACTION PHACO AND INTRAOCULAR LENS PLACEMENT ([facility]);  Surgeon: Elta Guadeloupe [name], MD;  Location:  [facility];  Service: Ophthalmology;  Laterality: Left;  CDE:12.65   CHOLECYSTECTOMY     COLONOSCOPY N/A 06/29/2012   Procedure: COLONOSCOPY;  Surgeon: Rogene Houston, MD;  Location: [facility] END[facility] SUITE;  Service: Endoscopy;  Laterality: N/A;  1200-moved to 1215 [name] to notify pt   REVERSE SHOULDER ARTHROPLASTY Right 09/03/2017   Procedure: REVERSE RIGHT SHOULDER ARTHROPLASTY;  Surgeon: Justice Britain, MD;  Location: [facility];  Service: Orthopedics;  Laterality: Right;   YAG LASER APPLICATION Right 0/86/5784   Procedure: YAG LASER APPLICATION;  Surgeon: Rutherford Guys, MD;  Location: [facility];  Service: Ophthalmology;  Laterality: Right;   YAG LASER APPLICATION Left 08/16/6293   Procedure: YAG LASER APPLICATION;  Surgeon: Rutherford Guys, MD;  Location: [facility];  Service: Ophthalmology;  Laterality: Left;     OB History   No obstetric history on file.     Family History  Problem Relation Age of Onset   Heart disease Mother    Cerebrovascular Disease Father    Cancer Sister    Cerebrovascular Disease Brother    Diabetes Brother    Heart attack Brother    Liver disease Brother     Social History   Tobacco Use   Smoking status: Never   Smokeless tobacco: Never  Vaping Use   Vaping Use: Never used  Substance Use  Topics   Alcohol use: No   Drug use: No    Home Medications Prior to Admission medications   Medication Sig Start Date End Date Taking? Authorizing Provider  acetaminophen (TYLENOL) 500 MG tablet Take 1,000 mg by mouth every 6 (six) hours as needed for moderate pain.    [provider]  Apoaequorin (PREVAGEN) 10 MG CAPS Take 10 mg by mouth daily.     [provider]  aspirin (ASPIRIN EC) 81 MG EC tablet Take 81 mg by mouth daily. Swallow whole.    [provider]  atenolol-chlorthalidone (TENORETIC) 50-25 MG per tablet Take 1 tablet by mouth daily.    [provider]  Cholecalciferol (VITAMIN D) 2000 units tablet Take 2,000 Units by mouth daily.     [provider]  Javier Docker Oil (OMEGA-3) 500 MG CAPS Take 500 mg by mouth at bedtime.    [provider]  lisinopril (PRINIVIL,ZESTRIL) 2.5 MG tablet Take 2.5 mg by mouth daily.  05/30/16   [provider]  metFORMIN (GLUCOPHAGE) 1000 MG tablet Take 1,000 mg by mouth 2 (two) times daily with a meal.  05/30/16   [provider]  Multiple Vitamins-Minerals (PRESERVISION AREDS 2) CAPS Take 1 capsule by mouth daily.    [provider]  ondansetron (ZOFRAN) 4 MG tablet Take 1 tablet (4 mg total) by mouth every 8 (eight) hours as needed for nausea or vomiting. Patient not taking: Reported on 09/07/2017 09/04/17   Shuford, Olivia Mackie, PA-C  traMADol (ULTRAM) 50 MG tablet Take 1 tablet (50 mg total) by mouth every 6 (six) hours as needed. 09/04/17   Shuford, Olivia Mackie, PA-C  triamcinolone cream (KENALOG) 0.1 % Apply 1 application topically 2 (two) times daily as needed. For itchy legs. 07/02/17   [provider]    Allergies    Codeine  Review of Systems   Review of Systems  Respiratory:  Positive for cough and shortness of breath.   Cardiovascular:  Positive for palpitations.  All other systems reviewed and are negative.  Physical Exam Updated Vital Signs BP 119/82   Pulse 95   Temp 98.7 F (37.1 C) (Oral)   Resp (!) 22   Ht 5\' 6"  (1.676 m)   Wt 75.3 kg   SpO2 97%   BMI 26.79 kg/m   Physical Exam Vitals and nursing note reviewed.  Constitutional:      Appearance: She is well-developed.  HENT:     Head: Normocephalic and atraumatic.     Mouth/Throat:     Mouth: Mucous membranes are moist.     Pharynx: Oropharynx is clear.  Eyes:     Extraocular Movements: Extraocular movements intact.     Pupils: Pupils are equal, round, and reactive to light.  Cardiovascular:     Rate and Rhythm: Tachycardia present. Rhythm irregular.  Pulmonary:     Effort: Pulmonary effort is normal.     Breath sounds: Normal breath sounds.  Abdominal:     General: Bowel  sounds are normal.     Palpations: Abdomen is soft.  Musculoskeletal:        General: Normal range of motion.     Cervical back: Normal range of motion and neck supple.  Skin:    General: Skin is warm.     Capillary Refill: Capillary refill takes less than 2 seconds.  Neurological:     General: No focal deficit present.     Mental Status: She is alert and oriented to person, place, and time.  Psychiatric:        Mood and Affect: Mood normal.        Behavior: Behavior normal.    ED Results / Procedures / Treatments   Labs (all labs ordered are listed, but only abnormal results are displayed) Labs Reviewed  BASIC METABOLIC PANEL - Abnormal; Notable for the following components:      Result Value   Glucose, Bld 181 (*)    Calcium 8.1 (*)    All other components within normal limits  BRAIN NATRIURETIC PEPTIDE - Abnormal; Notable for the following components:   B Natriuretic Peptide 387.0 (*)    All other components within normal limits  CBC WITH DIFFERENTIAL/PLATELET - Abnormal; Notable for the following components:   RDW 15.7 (*)    All other components within normal limits  RESP PANEL BY RT-PCR (FLU A&B, COVID) ARPGX2  CBC WITH DIFFERENTIAL/PLATELET  POC OCCULT BLOOD, ED    EKG None  Radiology DG Chest Port 1 View  Result Date: 12/06/2020 CLINICAL DATA:  Shortness of breath EXAM: PORTABLE CHEST 1 VIEW COMPARISON:  07/24/2020 FINDINGS: Cardiomegaly. Pulmonary vascular prominence without overt pulmonary edema. Unchanged elevation of the right hemidiaphragm and eventration of the left hemidiaphragm associated pleural thickening. The visualized skeletal structures are unremarkable. IMPRESSION: Cardiomegaly with pulmonary vascular prominence but without overt pulmonary edema. Electronically Signed   By: Delanna Ahmadi M.D.   On: 12/06/2020 12:40    Procedures Procedures   Medications Ordered in ED Medications  furosemide (LASIX) injection 20 mg (20 mg Intravenous Given  12/06/20 1415)    ED Course  I have reviewed the triage vital signs and the nursing notes.  Pertinent labs & imaging results that were available during my care of the patient were reviewed by me and considered in my medical decision making (see chart for details).    MDM Rules/Calculators/A&P                           Pt does have some increased pulmonary vascular congestion.  She has recently been started on lasix 20 mg daily.  She is given 1 dose of 20 mg iv in ED.  She is not hypoxic and does not looks toxic.  CHA2DS2/VAS Stroke Risk Points  80 (age, sex, chf, htn, dm).  She is on Eliquis.  HR did elevate briefly, but is back down to normal.  She has not taken any of her home meds today.  She took them in the ED.     She is to return if worse.  F/u with pcp/cards.     Final Clinical Impression(s) / ED Diagnoses Final diagnoses:  Acute pulmonary edema (HCC)  Paroxysmal atrial fibrillation [facility])    Rx / DC Orders ED Discharge Orders     None        Isla Pence, MD 12/06/20 1506

## 2020-12-06 NOTE — ED Triage Notes (Signed)
Pt c/o sob and bleeding from rectum

## 2020-12-12 ENCOUNTER — Encounter (INDEPENDENT_AMBULATORY_CARE_PROVIDER_SITE_OTHER): Payer: Self-pay | Admitting: *Deleted

## 2020-12-12 DIAGNOSIS — Z6828 Body mass index (BMI) 28.0-28.9, adult: Secondary | ICD-10-CM | POA: Diagnosis not present

## 2020-12-12 DIAGNOSIS — I5023 Acute on chronic systolic (congestive) heart failure: Secondary | ICD-10-CM | POA: Diagnosis not present

## 2021-01-10 DIAGNOSIS — I482 Chronic atrial fibrillation, unspecified: Secondary | ICD-10-CM | POA: Diagnosis not present

## 2021-01-10 DIAGNOSIS — E785 Hyperlipidemia, unspecified: Secondary | ICD-10-CM | POA: Diagnosis not present

## 2021-01-10 DIAGNOSIS — I5042 Chronic combined systolic (congestive) and diastolic (congestive) heart failure: Secondary | ICD-10-CM | POA: Diagnosis not present

## 2021-01-23 DIAGNOSIS — I482 Chronic atrial fibrillation, unspecified: Secondary | ICD-10-CM | POA: Diagnosis not present

## 2021-01-24 DIAGNOSIS — I482 Chronic atrial fibrillation, unspecified: Secondary | ICD-10-CM | POA: Diagnosis not present

## 2021-02-06 DIAGNOSIS — M5136 Other intervertebral disc degeneration, lumbar region: Secondary | ICD-10-CM | POA: Diagnosis not present

## 2021-03-06 DIAGNOSIS — Z6826 Body mass index (BMI) 26.0-26.9, adult: Secondary | ICD-10-CM | POA: Diagnosis not present

## 2021-03-06 DIAGNOSIS — F32A Depression, unspecified: Secondary | ICD-10-CM | POA: Diagnosis not present

## 2021-03-06 DIAGNOSIS — I5023 Acute on chronic systolic (congestive) heart failure: Secondary | ICD-10-CM | POA: Diagnosis not present

## 2021-03-06 DIAGNOSIS — E1169 Type 2 diabetes mellitus with other specified complication: Secondary | ICD-10-CM | POA: Diagnosis not present

## 2021-03-06 DIAGNOSIS — I5022 Chronic systolic (congestive) heart failure: Secondary | ICD-10-CM | POA: Diagnosis not present

## 2021-03-25 DIAGNOSIS — J40 Bronchitis, not specified as acute or chronic: Secondary | ICD-10-CM | POA: Diagnosis not present

## 2021-03-25 DIAGNOSIS — Z6827 Body mass index (BMI) 27.0-27.9, adult: Secondary | ICD-10-CM | POA: Diagnosis not present

## 2021-03-26 DIAGNOSIS — E113393 Type 2 diabetes mellitus with moderate nonproliferative diabetic retinopathy without macular edema, bilateral: Secondary | ICD-10-CM | POA: Diagnosis not present

## 2021-03-26 DIAGNOSIS — H524 Presbyopia: Secondary | ICD-10-CM | POA: Diagnosis not present

## 2021-04-16 DIAGNOSIS — I5042 Chronic combined systolic (congestive) and diastolic (congestive) heart failure: Secondary | ICD-10-CM | POA: Diagnosis not present

## 2021-04-16 DIAGNOSIS — I482 Chronic atrial fibrillation, unspecified: Secondary | ICD-10-CM | POA: Diagnosis not present

## 2021-04-16 DIAGNOSIS — E785 Hyperlipidemia, unspecified: Secondary | ICD-10-CM | POA: Diagnosis not present

## 2021-06-05 DIAGNOSIS — E1169 Type 2 diabetes mellitus with other specified complication: Secondary | ICD-10-CM | POA: Diagnosis not present

## 2021-06-05 DIAGNOSIS — E1165 Type 2 diabetes mellitus with hyperglycemia: Secondary | ICD-10-CM | POA: Diagnosis not present

## 2021-06-05 DIAGNOSIS — I5023 Acute on chronic systolic (congestive) heart failure: Secondary | ICD-10-CM | POA: Diagnosis not present

## 2021-06-05 DIAGNOSIS — N952 Postmenopausal atrophic vaginitis: Secondary | ICD-10-CM | POA: Diagnosis not present

## 2021-06-05 DIAGNOSIS — Z6827 Body mass index (BMI) 27.0-27.9, adult: Secondary | ICD-10-CM | POA: Diagnosis not present

## 2021-06-05 DIAGNOSIS — F331 Major depressive disorder, recurrent, moderate: Secondary | ICD-10-CM | POA: Diagnosis not present

## 2021-06-05 DIAGNOSIS — I10 Essential (primary) hypertension: Secondary | ICD-10-CM | POA: Diagnosis not present

## 2021-06-07 DIAGNOSIS — E1169 Type 2 diabetes mellitus with other specified complication: Secondary | ICD-10-CM | POA: Diagnosis not present

## 2021-06-07 DIAGNOSIS — I10 Essential (primary) hypertension: Secondary | ICD-10-CM | POA: Diagnosis not present

## 2021-06-07 DIAGNOSIS — F331 Major depressive disorder, recurrent, moderate: Secondary | ICD-10-CM | POA: Diagnosis not present

## 2021-06-07 DIAGNOSIS — I5023 Acute on chronic systolic (congestive) heart failure: Secondary | ICD-10-CM | POA: Diagnosis not present

## 2021-06-10 ENCOUNTER — Encounter (INDEPENDENT_AMBULATORY_CARE_PROVIDER_SITE_OTHER): Payer: Self-pay | Admitting: *Deleted

## 2021-07-16 DIAGNOSIS — I482 Chronic atrial fibrillation, unspecified: Secondary | ICD-10-CM | POA: Diagnosis not present

## 2021-07-16 DIAGNOSIS — E785 Hyperlipidemia, unspecified: Secondary | ICD-10-CM | POA: Diagnosis not present

## 2021-07-16 DIAGNOSIS — I5042 Chronic combined systolic (congestive) and diastolic (congestive) heart failure: Secondary | ICD-10-CM | POA: Diagnosis not present

## 2021-08-07 DIAGNOSIS — I10 Essential (primary) hypertension: Secondary | ICD-10-CM | POA: Diagnosis not present

## 2021-08-07 DIAGNOSIS — F331 Major depressive disorder, recurrent, moderate: Secondary | ICD-10-CM | POA: Diagnosis not present

## 2021-08-07 DIAGNOSIS — I5023 Acute on chronic systolic (congestive) heart failure: Secondary | ICD-10-CM | POA: Diagnosis not present

## 2021-08-07 DIAGNOSIS — E1169 Type 2 diabetes mellitus with other specified complication: Secondary | ICD-10-CM | POA: Diagnosis not present

## 2021-09-04 DIAGNOSIS — N952 Postmenopausal atrophic vaginitis: Secondary | ICD-10-CM | POA: Diagnosis not present

## 2021-09-04 DIAGNOSIS — Z6826 Body mass index (BMI) 26.0-26.9, adult: Secondary | ICD-10-CM | POA: Diagnosis not present

## 2021-09-04 DIAGNOSIS — I10 Essential (primary) hypertension: Secondary | ICD-10-CM | POA: Diagnosis not present

## 2021-09-04 DIAGNOSIS — E1169 Type 2 diabetes mellitus with other specified complication: Secondary | ICD-10-CM | POA: Diagnosis not present

## 2021-09-04 DIAGNOSIS — I5032 Chronic diastolic (congestive) heart failure: Secondary | ICD-10-CM | POA: Diagnosis not present

## 2021-09-04 DIAGNOSIS — M5432 Sciatica, left side: Secondary | ICD-10-CM | POA: Diagnosis not present

## 2021-11-06 DIAGNOSIS — M170 Bilateral primary osteoarthritis of knee: Secondary | ICD-10-CM | POA: Diagnosis not present

## 2021-11-06 DIAGNOSIS — M1712 Unilateral primary osteoarthritis, left knee: Secondary | ICD-10-CM | POA: Diagnosis not present

## 2021-11-06 DIAGNOSIS — M1711 Unilateral primary osteoarthritis, right knee: Secondary | ICD-10-CM | POA: Diagnosis not present

## 2021-11-12 DIAGNOSIS — M1712 Unilateral primary osteoarthritis, left knee: Secondary | ICD-10-CM | POA: Diagnosis not present

## 2021-11-15 ENCOUNTER — Telehealth: Payer: Self-pay | Admitting: *Deleted

## 2021-11-15 NOTE — Patient Outreach (Signed)
  Care Coordination   11/15/2021 Name: Jill Fisher MRN: [id number] DOB: March 10, 1935   Care Coordination Outreach Attempts:  An unsuccessful telephone outreach was attempted today to offer the patient information about available care coordination services as a benefit of their health plan.   Follow Up Plan:  Additional outreach attempts will be made to offer the patient care coordination information and services.   Encounter Outcome:  No Answer  Care Coordination Interventions Activated:  No   Care Coordination Interventions:  No, not indicated    Valente David, RN, MSN, [facility] Care Management Care Management Coordinator [phone]

## 2021-11-20 ENCOUNTER — Telehealth: Payer: Self-pay | Admitting: *Deleted

## 2021-11-20 DIAGNOSIS — M1712 Unilateral primary osteoarthritis, left knee: Secondary | ICD-10-CM | POA: Diagnosis not present

## 2021-11-20 NOTE — Patient Outreach (Signed)
  Care Coordination   11/20/2021 Name: Jill Fisher MRN: [id number] DOB: 1934-09-28   Care Coordination Outreach Attempts:  A second unsuccessful outreach was attempted today to offer the patient with information about available care coordination services as a benefit of their health plan.     Follow Up Plan:  Additional outreach attempts will be made to offer the patient care coordination information and services.   Encounter Outcome:  No Answer  Care Coordination Interventions Activated:  No   Care Coordination Interventions:  No, not indicated    Valente David, RN, MSN, [facility] Care Management Care Management Coordinator [phone]

## 2021-11-21 DIAGNOSIS — L0100 Impetigo, unspecified: Secondary | ICD-10-CM | POA: Diagnosis not present

## 2021-11-21 DIAGNOSIS — Z85828 Personal history of other malignant neoplasm of skin: Secondary | ICD-10-CM | POA: Diagnosis not present

## 2021-11-25 ENCOUNTER — Telehealth: Payer: Self-pay | Admitting: *Deleted

## 2021-11-25 NOTE — Patient Outreach (Signed)
  Care Coordination   Initial Visit Note   11/25/2021 Name: Jill Fisher MRN: [id number] DOB: 11/07/1934  Jill Fisher is a 87 y.o. year old female who sees Hasanaj, Samul Dada, MD for primary care. I spoke with  [name] by phone today.  What matters to the patients health and wellness today?  Unable to complete assessment, state this is not a good time to talk and she will call back.        SDOH assessments and interventions completed:  No     Care Coordination Interventions Activated:  No  Care Coordination Interventions:  No, not indicated   Follow up plan:  Will await call back from member to engage.    Encounter Outcome:  Pt. Request to Call [place], RN, MSN, [place] Management Care Management Coordinator [phone]

## 2021-11-26 ENCOUNTER — Telehealth: Payer: Self-pay | Admitting: *Deleted

## 2021-11-26 NOTE — Patient Outreach (Signed)
  Care Coordination   11/26/2021 Name: Jill Fisher MRN: [id number] DOB: 1934-03-13   Care Coordination Outreach Attempts:  Voice message received from member, return call unsuccessful, message left.    Follow Up Plan:  No further outreach attempts will be made at this time. We have been unable to contact the patient to offer or enroll patient in care coordination services  Will open case if member calls back.  Encounter Outcome:  No Answer  Care Coordination Interventions Activated:  No   Care Coordination Interventions:  No, not indicated    Valente David, RN, MSN, [facility] Care Management Care Management Coordinator [phone]

## 2021-11-28 ENCOUNTER — Encounter: Payer: Self-pay | Admitting: *Deleted

## 2021-11-28 ENCOUNTER — Telehealth: Payer: Self-pay | Admitting: *Deleted

## 2021-11-28 DIAGNOSIS — E1169 Type 2 diabetes mellitus with other specified complication: Secondary | ICD-10-CM

## 2021-11-28 NOTE — Patient Instructions (Signed)
Visit Information  Thank you for taking time to visit with me today. Please don't hesitate to contact me if I can be of assistance to you before our next scheduled telephone appointment.  Following are the goals we discussed today:  Discuss alternative treatment options with ortho doctor. Keep and attend PCP visit next week  Our next appointment is by telephone on 10/19   Please call the care guide team at [phone] if you need to cancel or reschedule your appointment.   Please call the Suicide and Crisis Lifeline: 988 call the [place] National Suicide Prevention Lifeline: (502)406-6649 or TTY: 706-869-5378 TTY (519)164-5685) to talk to a trained counselor call 1-800-273-TALK (toll free, 24 hour hotline) call the [facility]: 3432878498 call 911 if you are experiencing a Mental Health or [place] or need someone to talk to.  The patient verbalized understanding of instructions, educational materials, and care plan provided today and agreed to receive a mailed copy of patient instructions, educational materials, and care plan.   The patient has been provided with contact information for the care management team and has been advised to call with any health related questions or concerns.   Valente David, RN, MSN, [place] Care Management Care Management Coordinator [phone]

## 2021-11-28 NOTE — Patient Outreach (Signed)
  Care Coordination   Initial Visit Note   11/28/2021 Name: Jill Fisher MRN: [id number] DOB: 05/20/1934  Jill Fisher is a 87 y.o. year old female who sees Hasanaj, Samul Dada, MD for primary care. I spoke with  Jill Fisher by phone today.  What matters to the patients health and wellness today?  Having trouble controlling ongoing knee pain.  State she was getting steroid injections, but had to stop due to complication.  Her next intervention was chicken shots, which has not worked.   She know she is not a candidate for knee replacement, but will follow up with ortho within the next 6 weeks.  Will see PCP next week, if this is not her AWV she will schedule.  Denies any urgent concerns, encouraged to contact this care manager with questions.    Goals Addressed               This Visit's Progress     Relief of knee pain (pt-stated)        Care Coordination Interventions: Reviewed provider established plan for pain management Discussed importance of adherence to all scheduled medical appointments Discussed use of relaxation techniques and/or diversional activities to assist with pain reduction (distraction, imagery, relaxation, massage, acupressure, TENS, heat, and cold application Advised patient to discuss alternative pain treatment (injections) with provider Assessed social determinant of health barriers Discussed need for AWV, advised to call to schedule Reviewed upcoming appointment with ortho within the next 4-6 weeks Referral placed to community resource care guide for financial concern         SDOH assessments and interventions completed:  Yes  SDOH Interventions Today    Flowsheet Row Most Recent Value  SDOH Interventions   Food Insecurity Interventions Other (Comment)  [care guide referral]  Housing Interventions Intervention Not Indicated  Transportation Interventions Intervention Not Indicated  Utilities Interventions Other (Comment)  [care guide referral]         Care Coordination Interventions Activated:  Yes  Care Coordination Interventions:  Yes, provided   Follow up plan: Follow up call scheduled for 10/19    Encounter Outcome:  Pt. Visit Completed   Valente David, RN, MSN, [place] Care Management Care Management Coordinator [phone]

## 2021-11-29 ENCOUNTER — Telehealth: Payer: Self-pay

## 2021-11-29 NOTE — Telephone Encounter (Signed)
   Telephone encounter was:  Successful.  11/29/2021 Name: Jill Fisher MRN: [id number] DOB: 10/29/1934  Jill Fisher is a 87 y.o. year old female who is a primary care patient of Hasanaj, Samul Dada, MD . The community resource team was consulted for assistance with Food Insecurity and Financial Difficulties related to utilities.  Care guide performed the following interventions: Spoke with patient, she gave consent to send a referral to [facility]. Patient gave consent to send a referral to [place] via [phone]. Verified home address to send information for food pantries and utility assistance.    Follow Up Plan:  Care guide will follow up with patient by phone over the next 7 days and I will place a referral with [facility]/food pantry.  [place] Resource Care Guide   ??[name]  ?? [phone]   Website: [email or web address]  [email or web address]  "We don't say no, we SHOW how!"         [facility] Health Department

## 2021-12-04 ENCOUNTER — Telehealth: Payer: Self-pay

## 2021-12-04 NOTE — Telephone Encounter (Signed)
   Telephone encounter was:  Unsuccessful.  12/04/2021 Name: Jill Fisher MRN: [id number] DOB: 07/15/1934  Unsuccessful outbound call made today to assist with:  Left message on voicemail for patient to return my call regarding referral to [place] and [id number] referral to [place] program. Received message via [place] from [place] program referral has been accepted and patient will called this week.  Outreach Attempt:  2nd Attempt  A HIPAA compliant voice message was left requesting a return call.  Instructed patient to call back at [phone].  [place] Resource Care Guide   ??[email or web address]  ?? [phone]   Website: [email or web address]  [email or web address]  "We don't say no, we SHOW how!"         [facility] Health Department

## 2021-12-05 ENCOUNTER — Telehealth: Payer: Self-pay

## 2021-12-05 DIAGNOSIS — L570 Actinic keratosis: Secondary | ICD-10-CM | POA: Diagnosis not present

## 2021-12-05 NOTE — Telephone Encounter (Signed)
   Telephone encounter was:  Unsuccessful.  12/05/2021 Name: Jill Fisher MRN: [id number] DOB: [birth year]  Unsuccessful outbound call made today to assist with:  Food Insecurity and Financial Difficulties related to utilities.  Outreach Attempt:  3rd Attempt.  Referral closed unable to contact patient.  A HIPAA compliant voice message was left requesting a return call.  Instructed patient to call back at [phone].  Left message on voicemail for patient to return my call regarding referral to [place] and [id number] referral to [place] Guide   ??[email or web address]  ?? [phone]   Website: [email or web address]  [email or web address]  "We don't say no, we SHOW how!"         [facility] Health Department

## 2021-12-11 DIAGNOSIS — Z0000 Encounter for general adult medical examination without abnormal findings: Secondary | ICD-10-CM | POA: Diagnosis not present

## 2021-12-11 DIAGNOSIS — N952 Postmenopausal atrophic vaginitis: Secondary | ICD-10-CM | POA: Diagnosis not present

## 2021-12-11 DIAGNOSIS — M5432 Sciatica, left side: Secondary | ICD-10-CM | POA: Diagnosis not present

## 2021-12-11 DIAGNOSIS — I10 Essential (primary) hypertension: Secondary | ICD-10-CM | POA: Diagnosis not present

## 2021-12-11 DIAGNOSIS — Z6825 Body mass index (BMI) 25.0-25.9, adult: Secondary | ICD-10-CM | POA: Diagnosis not present

## 2021-12-11 DIAGNOSIS — I5032 Chronic diastolic (congestive) heart failure: Secondary | ICD-10-CM | POA: Diagnosis not present

## 2021-12-11 DIAGNOSIS — E1169 Type 2 diabetes mellitus with other specified complication: Secondary | ICD-10-CM | POA: Diagnosis not present

## 2021-12-26 ENCOUNTER — Ambulatory Visit: Payer: Self-pay | Admitting: *Deleted

## 2021-12-26 NOTE — Patient Outreach (Signed)
  Care Coordination   12/26/2021 Name: Jill Fisher MRN: [id number] DOB: 06-20-1934   Care Coordination Outreach Attempts:  An unsuccessful telephone outreach was attempted for a scheduled appointment today.  Follow Up Plan:  Additional outreach attempts will be made to offer the patient care coordination information and services.   Encounter Outcome:  No Answer  Care Coordination Interventions Activated:  No   Care Coordination Interventions:  No, not indicated    Valente David, RN, MSN, [facility] Care Management Care Management Coordinator [phone]

## 2022-01-01 DIAGNOSIS — M8589 Other specified disorders of bone density and structure, multiple sites: Secondary | ICD-10-CM | POA: Diagnosis not present

## 2022-01-01 DIAGNOSIS — M810 Age-related osteoporosis without current pathological fracture: Secondary | ICD-10-CM | POA: Diagnosis not present

## 2022-01-10 ENCOUNTER — Telehealth: Payer: Self-pay | Admitting: *Deleted

## 2022-01-10 NOTE — Progress Notes (Signed)
  Care Coordination Note  01/10/2022 Name: Jill Fisher MRN: [id number] DOB: June 09, 1934  Jill Fisher is a 87 y.o. year old female who is a primary care patient of Hasanaj, Samul Dada, MD and is actively engaged with the care management team. I reached out to [name] by phone today to assist with re-scheduling a follow up visit with the RN Case Manager  Follow up plan: Unsuccessful telephone outreach attempt made. A HIPAA compliant phone message was left for the patient providing contact information and requesting a return call.   [place]  Direct Dial: [phone]

## 2022-01-15 DIAGNOSIS — I5042 Chronic combined systolic (congestive) and diastolic (congestive) heart failure: Secondary | ICD-10-CM | POA: Diagnosis not present

## 2022-01-15 DIAGNOSIS — I482 Chronic atrial fibrillation, unspecified: Secondary | ICD-10-CM | POA: Diagnosis not present

## 2022-01-15 DIAGNOSIS — E785 Hyperlipidemia, unspecified: Secondary | ICD-10-CM | POA: Diagnosis not present

## 2022-01-16 DIAGNOSIS — L570 Actinic keratosis: Secondary | ICD-10-CM | POA: Diagnosis not present

## 2022-01-17 NOTE — Progress Notes (Signed)
  Care Coordination Note  01/17/2022 Name: Jill Fisher MRN: [id number] DOB: [birth year]  Jill Fisher is a 87 y.o. year old female who is a primary care patient of Hasanaj, Samul Dada, MD and is actively engaged with the care management team. I reached out to [name] by phone today to assist with re-scheduling a follow up visit with the RN Case Manager  Follow up plan: Unsuccessful telephone outreach attempt made. A HIPAA compliant phone message was left for the patient providing contact information and requesting a return call.   [place]  Direct Dial: [phone]

## 2022-01-23 NOTE — Telephone Encounter (Signed)
  Care Coordination Note  01/23/2022 Name: Jill Fisher MRN: [id number] DOB: November 20, 1934  Jill Fisher is a 87 y.o. year old female who is a primary care patient of Hasanaj, Samul Dada, MD and is actively engaged with the care management team. I reached out to [name] by phone today to assist with re-scheduling a follow up visit with the RN Case Manager  Follow up plan: Unsuccessful telephone outreach attempt made. A HIPAA compliant phone message was left for the patient providing contact information and requesting a return call.  We have been unable to make contact with the patient for follow up. The care management team is available to follow up with the patient after provider conversation with the patient regarding recommendation for care management engagement and subsequent re-referral to the care management team.   [place]  Direct Dial: [phone]

## 2022-03-13 DIAGNOSIS — I5032 Chronic diastolic (congestive) heart failure: Secondary | ICD-10-CM | POA: Diagnosis not present

## 2022-03-13 DIAGNOSIS — N182 Chronic kidney disease, stage 2 (mild): Secondary | ICD-10-CM | POA: Diagnosis not present

## 2022-03-13 DIAGNOSIS — E1169 Type 2 diabetes mellitus with other specified complication: Secondary | ICD-10-CM | POA: Diagnosis not present

## 2022-03-13 DIAGNOSIS — I10 Essential (primary) hypertension: Secondary | ICD-10-CM | POA: Diagnosis not present

## 2022-03-13 DIAGNOSIS — N952 Postmenopausal atrophic vaginitis: Secondary | ICD-10-CM | POA: Diagnosis not present

## 2022-03-13 DIAGNOSIS — M65332 Trigger finger, left middle finger: Secondary | ICD-10-CM | POA: Diagnosis not present

## 2022-03-13 DIAGNOSIS — M5432 Sciatica, left side: Secondary | ICD-10-CM | POA: Diagnosis not present

## 2022-03-13 DIAGNOSIS — Z6826 Body mass index (BMI) 26.0-26.9, adult: Secondary | ICD-10-CM | POA: Diagnosis not present

## 2022-03-13 DIAGNOSIS — Z0000 Encounter for general adult medical examination without abnormal findings: Secondary | ICD-10-CM | POA: Diagnosis not present

## 2022-04-28 DIAGNOSIS — L03011 Cellulitis of right finger: Secondary | ICD-10-CM | POA: Diagnosis not present

## 2022-04-28 DIAGNOSIS — Z6826 Body mass index (BMI) 26.0-26.9, adult: Secondary | ICD-10-CM | POA: Diagnosis not present

## 2022-05-08 DIAGNOSIS — H43811 Vitreous degeneration, right eye: Secondary | ICD-10-CM | POA: Diagnosis not present

## 2022-05-19 DIAGNOSIS — D239 Other benign neoplasm of skin, unspecified: Secondary | ICD-10-CM | POA: Diagnosis not present

## 2022-05-19 DIAGNOSIS — Z1283 Encounter for screening for malignant neoplasm of skin: Secondary | ICD-10-CM | POA: Diagnosis not present

## 2022-05-19 DIAGNOSIS — Z85828 Personal history of other malignant neoplasm of skin: Secondary | ICD-10-CM | POA: Diagnosis not present

## 2022-06-16 DIAGNOSIS — E113393 Type 2 diabetes mellitus with moderate nonproliferative diabetic retinopathy without macular edema, bilateral: Secondary | ICD-10-CM | POA: Diagnosis not present

## 2022-06-16 DIAGNOSIS — H524 Presbyopia: Secondary | ICD-10-CM | POA: Diagnosis not present

## 2022-06-26 DIAGNOSIS — Z6826 Body mass index (BMI) 26.0-26.9, adult: Secondary | ICD-10-CM | POA: Diagnosis not present

## 2022-06-26 DIAGNOSIS — I10 Essential (primary) hypertension: Secondary | ICD-10-CM | POA: Diagnosis not present

## 2022-06-26 DIAGNOSIS — K51518 Left sided colitis with other complication: Secondary | ICD-10-CM | POA: Diagnosis not present

## 2022-07-07 DIAGNOSIS — M79605 Pain in left leg: Secondary | ICD-10-CM | POA: Diagnosis not present

## 2022-07-07 DIAGNOSIS — R079 Chest pain, unspecified: Secondary | ICD-10-CM | POA: Diagnosis not present

## 2022-07-07 DIAGNOSIS — R0781 Pleurodynia: Secondary | ICD-10-CM | POA: Diagnosis not present

## 2022-07-07 DIAGNOSIS — N390 Urinary tract infection, site not specified: Secondary | ICD-10-CM | POA: Diagnosis not present

## 2022-07-07 DIAGNOSIS — I509 Heart failure, unspecified: Secondary | ICD-10-CM | POA: Diagnosis not present

## 2022-07-07 DIAGNOSIS — I480 Paroxysmal atrial fibrillation: Secondary | ICD-10-CM | POA: Diagnosis not present

## 2022-07-07 DIAGNOSIS — Z8632 Personal history of gestational diabetes: Secondary | ICD-10-CM | POA: Diagnosis not present

## 2022-07-07 DIAGNOSIS — E663 Overweight: Secondary | ICD-10-CM | POA: Diagnosis not present

## 2022-07-07 DIAGNOSIS — R1012 Left upper quadrant pain: Secondary | ICD-10-CM | POA: Diagnosis not present

## 2022-07-07 DIAGNOSIS — Z6828 Body mass index (BMI) 28.0-28.9, adult: Secondary | ICD-10-CM | POA: Diagnosis not present

## 2022-07-07 DIAGNOSIS — I4891 Unspecified atrial fibrillation: Secondary | ICD-10-CM | POA: Diagnosis not present

## 2022-07-07 DIAGNOSIS — R0602 Shortness of breath: Secondary | ICD-10-CM | POA: Diagnosis not present

## 2022-07-07 DIAGNOSIS — J9811 Atelectasis: Secondary | ICD-10-CM | POA: Diagnosis not present

## 2022-07-09 DIAGNOSIS — N3000 Acute cystitis without hematuria: Secondary | ICD-10-CM | POA: Diagnosis not present

## 2022-07-09 DIAGNOSIS — Z6826 Body mass index (BMI) 26.0-26.9, adult: Secondary | ICD-10-CM | POA: Diagnosis not present

## 2022-07-09 DIAGNOSIS — I480 Paroxysmal atrial fibrillation: Secondary | ICD-10-CM | POA: Diagnosis not present

## 2022-07-16 DIAGNOSIS — E785 Hyperlipidemia, unspecified: Secondary | ICD-10-CM | POA: Diagnosis not present

## 2022-07-16 DIAGNOSIS — I482 Chronic atrial fibrillation, unspecified: Secondary | ICD-10-CM | POA: Diagnosis not present

## 2022-07-16 DIAGNOSIS — I5042 Chronic combined systolic (congestive) and diastolic (congestive) heart failure: Secondary | ICD-10-CM | POA: Diagnosis not present

## 2022-07-28 DIAGNOSIS — N182 Chronic kidney disease, stage 2 (mild): Secondary | ICD-10-CM | POA: Diagnosis not present

## 2022-07-28 DIAGNOSIS — F331 Major depressive disorder, recurrent, moderate: Secondary | ICD-10-CM | POA: Diagnosis not present

## 2022-07-28 DIAGNOSIS — I480 Paroxysmal atrial fibrillation: Secondary | ICD-10-CM | POA: Diagnosis not present

## 2022-07-28 DIAGNOSIS — Z0000 Encounter for general adult medical examination without abnormal findings: Secondary | ICD-10-CM | POA: Diagnosis not present

## 2022-07-28 DIAGNOSIS — N3000 Acute cystitis without hematuria: Secondary | ICD-10-CM | POA: Diagnosis not present

## 2022-07-28 DIAGNOSIS — E1169 Type 2 diabetes mellitus with other specified complication: Secondary | ICD-10-CM | POA: Diagnosis not present

## 2022-07-28 DIAGNOSIS — Z6825 Body mass index (BMI) 25.0-25.9, adult: Secondary | ICD-10-CM | POA: Diagnosis not present

## 2022-07-28 DIAGNOSIS — I10 Essential (primary) hypertension: Secondary | ICD-10-CM | POA: Diagnosis not present

## 2022-07-28 DIAGNOSIS — I5032 Chronic diastolic (congestive) heart failure: Secondary | ICD-10-CM | POA: Diagnosis not present

## 2022-07-28 DIAGNOSIS — N952 Postmenopausal atrophic vaginitis: Secondary | ICD-10-CM | POA: Diagnosis not present

## 2022-07-28 DIAGNOSIS — J209 Acute bronchitis, unspecified: Secondary | ICD-10-CM | POA: Diagnosis not present

## 2022-08-08 DIAGNOSIS — N952 Postmenopausal atrophic vaginitis: Secondary | ICD-10-CM | POA: Diagnosis not present

## 2022-08-08 DIAGNOSIS — F331 Major depressive disorder, recurrent, moderate: Secondary | ICD-10-CM | POA: Diagnosis not present

## 2022-08-08 DIAGNOSIS — I480 Paroxysmal atrial fibrillation: Secondary | ICD-10-CM | POA: Diagnosis not present

## 2022-08-08 DIAGNOSIS — I5032 Chronic diastolic (congestive) heart failure: Secondary | ICD-10-CM | POA: Diagnosis not present

## 2022-08-08 DIAGNOSIS — I10 Essential (primary) hypertension: Secondary | ICD-10-CM | POA: Diagnosis not present

## 2022-08-08 DIAGNOSIS — E1169 Type 2 diabetes mellitus with other specified complication: Secondary | ICD-10-CM | POA: Diagnosis not present

## 2022-10-20 DIAGNOSIS — I10 Essential (primary) hypertension: Secondary | ICD-10-CM | POA: Diagnosis not present

## 2022-10-20 DIAGNOSIS — E1169 Type 2 diabetes mellitus with other specified complication: Secondary | ICD-10-CM | POA: Diagnosis not present

## 2022-10-20 DIAGNOSIS — N952 Postmenopausal atrophic vaginitis: Secondary | ICD-10-CM | POA: Diagnosis not present

## 2022-10-20 DIAGNOSIS — N1831 Chronic kidney disease, stage 3a: Secondary | ICD-10-CM | POA: Diagnosis not present

## 2022-10-20 DIAGNOSIS — I5032 Chronic diastolic (congestive) heart failure: Secondary | ICD-10-CM | POA: Diagnosis not present

## 2022-10-20 DIAGNOSIS — I480 Paroxysmal atrial fibrillation: Secondary | ICD-10-CM | POA: Diagnosis not present

## 2022-10-20 DIAGNOSIS — F331 Major depressive disorder, recurrent, moderate: Secondary | ICD-10-CM | POA: Diagnosis not present

## 2022-10-20 DIAGNOSIS — Z6825 Body mass index (BMI) 25.0-25.9, adult: Secondary | ICD-10-CM | POA: Diagnosis not present

## 2022-10-27 DIAGNOSIS — I10 Essential (primary) hypertension: Secondary | ICD-10-CM | POA: Diagnosis not present

## 2022-10-27 DIAGNOSIS — I480 Paroxysmal atrial fibrillation: Secondary | ICD-10-CM | POA: Diagnosis not present

## 2022-10-27 DIAGNOSIS — Z6825 Body mass index (BMI) 25.0-25.9, adult: Secondary | ICD-10-CM | POA: Diagnosis not present

## 2022-11-04 DIAGNOSIS — Z6825 Body mass index (BMI) 25.0-25.9, adult: Secondary | ICD-10-CM | POA: Diagnosis not present

## 2022-11-04 DIAGNOSIS — L570 Actinic keratosis: Secondary | ICD-10-CM | POA: Diagnosis not present

## 2022-11-04 DIAGNOSIS — I10 Essential (primary) hypertension: Secondary | ICD-10-CM | POA: Diagnosis not present

## 2022-11-17 DIAGNOSIS — L570 Actinic keratosis: Secondary | ICD-10-CM | POA: Diagnosis not present

## 2022-11-17 DIAGNOSIS — Z6824 Body mass index (BMI) 24.0-24.9, adult: Secondary | ICD-10-CM | POA: Diagnosis not present

## 2022-11-20 ENCOUNTER — Encounter: Payer: Self-pay | Admitting: Internal Medicine

## 2022-11-20 ENCOUNTER — Ambulatory Visit: Payer: Medicare HMO | Attending: Internal Medicine | Admitting: Internal Medicine

## 2022-11-20 VITALS — BP 120/74 | HR 97 | Ht 66.0 in | Wt 147.8 lb

## 2022-11-20 DIAGNOSIS — I471 Supraventricular tachycardia, unspecified: Secondary | ICD-10-CM | POA: Diagnosis not present

## 2022-11-20 DIAGNOSIS — I4891 Unspecified atrial fibrillation: Secondary | ICD-10-CM

## 2022-11-20 DIAGNOSIS — I071 Rheumatic tricuspid insufficiency: Secondary | ICD-10-CM | POA: Insufficient documentation

## 2022-11-20 DIAGNOSIS — I4819 Other persistent atrial fibrillation: Secondary | ICD-10-CM | POA: Diagnosis not present

## 2022-11-20 DIAGNOSIS — I340 Nonrheumatic mitral (valve) insufficiency: Secondary | ICD-10-CM

## 2022-11-20 DIAGNOSIS — I361 Nonrheumatic tricuspid (valve) insufficiency: Secondary | ICD-10-CM

## 2022-11-20 MED ORDER — NITROGLYCERIN 0.4 MG SL SUBL: 0.4000 mg | SUBLINGUAL_TABLET | SUBLINGUAL | 2 refills | Status: AC | PRN

## 2022-11-20 MED ORDER — METOPROLOL SUCCINATE ER 100 MG PO TB24: 150.0000 mg | ORAL_TABLET | Freq: Every day | ORAL | 2 refills | Status: DC

## 2022-11-20 NOTE — Progress Notes (Signed)
Cardiology Office Note  Date: 11/20/2022   ID: Jill Fisher, DOB [birth year], MRN [id number]  PCP:  Toma Deiters, MD  Cardiologist:  None Electrophysiologist:  None   History of Present Illness: Jill Fisher is a 88 y.o. female known to have persistent A-fib, valvular heart disease (MR and TR, moderate), HTN, DM 2, HLD was referred to cardiology clinic for management of A-fib.  Accompanied by daughter.  Patient was previously seeing Dr. Sharrell Ku at [facility] cardiology for management of persistent A-fib.  EKG today showed A-fib with RVR, HR 114 bpm, no symptoms except for mild SOB with exertion.  Sometimes wakes up in the middle of the night to catch air but she also over things a lot according to the daughter.  Has occasional palpitations, few times per month.  She also has chest pain with overexertion only.  She has this chest pain for quite a while since 2022, NM stress test in 2020 showed no evidence of ischemia or scar.  Echocardiogram in 2022 showed normal LVEF, 50%, moderate to severe TR and mild to moderate MR.  No leg swelling,  Past Medical History:  Diagnosis Date   A-fib (HCC)    Allergic arthritis of shoulder region, right    Cerebrovascular disease    followed at [facility]   Coronary artery disease    LVEF  65%   Diabetes mellitus without complication (HCC)    Type II   GERD (gastroesophageal reflux disease)    Hyperlipidemia    Hypertension     Past Surgical History:  Procedure Laterality Date   APPENDECTOMY     CARDIAC CATHETERIZATION     CAROTID ENDARTERECTOMY     left   CAROTID STENT INSERTION Left    CATARACT EXTRACTION W/PHACO Right 11/30/2012   Procedure: CATARACT EXTRACTION PHACO AND INTRAOCULAR LENS PLACEMENT (IOC);  Surgeon: Loraine Leriche T. Nile Riggs, MD;  Location: [facility] ORS;  Service: Ophthalmology;  Laterality: Right;  CDE:12.71   CATARACT EXTRACTION W/PHACO Left 12/21/2012   Procedure: CATARACT EXTRACTION PHACO AND INTRAOCULAR LENS PLACEMENT (IOC);  Surgeon: Loraine Leriche T.  Nile Riggs, MD;  Location: [facility] ORS;  Service: Ophthalmology;  Laterality: Left;  CDE:12.65   CHOLECYSTECTOMY     COLONOSCOPY N/A 06/29/2012   Procedure: COLONOSCOPY;  Surgeon: Malissa Hippo, MD;  Location: [facility] ENDO SUITE;  Service: Endoscopy;  Laterality: N/A;  1200-moved to 1215 [name] to notify pt   REVERSE SHOULDER ARTHROPLASTY Right 09/03/2017   Procedure: REVERSE RIGHT SHOULDER ARTHROPLASTY;  Surgeon: Francena Hanly, MD;  Location: [facility] OR;  Service: Orthopedics;  Laterality: Right;   YAG LASER APPLICATION Right 07/03/2015   Procedure: YAG LASER APPLICATION;  Surgeon: Jethro Bolus, MD;  Location: [facility] ORS;  Service: Ophthalmology;  Laterality: Right;   YAG LASER APPLICATION Left 07/17/2015   Procedure: YAG LASER APPLICATION;  Surgeon: Jethro Bolus, MD;  Location: [facility] ORS;  Service: Ophthalmology;  Laterality: Left;    Current Outpatient Medications  Medication Sig Dispense Refill   alendronate (FOSAMAX) 70 MG tablet Take 70 mg by mouth once a week.     apixaban (ELIQUIS) 5 MG TABS tablet Take 1 tablet by mouth 2 (two) times daily.     atorvastatin (LIPITOR) 20 MG tablet Take 20 mg by mouth at bedtime.     furosemide (LASIX) 20 MG tablet Take 20 mg by mouth daily.     gabapentin (NEURONTIN) 300 MG capsule Take 300 mg by mouth 2 (two) times daily.     glimepiride (AMARYL) 1 MG tablet  Take 1 mg by mouth daily.     losartan (COZAAR) 25 MG tablet Take 25 mg by mouth daily.     metFORMIN (GLUCOPHAGE) 1000 MG tablet Take 1,000 mg by mouth 2 (two) times daily with a meal.      metoprolol succinate (TOPROL-XL) 100 MG 24 hr tablet Take 100 mg by mouth daily.     nitroGLYCERIN (NITROSTAT) 0.4 MG SL tablet Place 1 tablet (0.4 mg total) under the tongue every 5 (five) minutes x 3 doses as needed for chest pain (if no relief after 3rd does proceed to ED or call 911). 25 tablet 2   diltiazem (CARDIZEM) 30 MG tablet Take 30 mg by mouth 4 (four) times daily as needed.     No current facility-administered medications  for this visit.   Allergies:  Codeine   Social History: The patient  reports that she has never smoked. She has never used smokeless tobacco. She reports that she does not drink alcohol and does not use drugs.   Family History: The patient's family history includes Cancer in her sister; Cerebrovascular Disease in her brother and father; Diabetes in her brother; Heart attack in her brother; Heart disease in her mother; Liver disease in her brother.   ROS:  Please see the history of present illness. Otherwise, complete review of systems is positive for none  All other systems are reviewed and negative.   Physical Exam: VS:  BP 120/74   Pulse 97   Ht 5\' 6"  (1.676 m)   Wt 147 lb 12.8 oz (67 kg)   SpO2 95%   BMI 23.86 kg/m , BMI Body mass index is 23.86 kg/m.  Wt Readings from Last 3 Encounters:  11/20/22 147 lb 12.8 oz (67 kg)  12/06/20 166 lb (75.3 kg)  08/27/17 153 lb 11.2 oz (69.7 kg)    General: Patient appears comfortable at rest. HEENT: Conjunctiva and lids normal, oropharynx clear with moist mucosa. Neck: Supple, no elevated JVP or carotid bruits, no thyromegaly. Lungs: Clear to auscultation, nonlabored breathing at rest. Cardiac: Irregular rate and rhythm, no S3 or significant systolic murmur, no pericardial rub. Abdomen: Soft, nontender, no hepatomegaly, bowel sounds present, no guarding or rebound. Extremities: No pitting edema, distal pulses 2+. Skin: Warm and dry. Musculoskeletal: No kyphosis. Neuropsychiatric: Alert and oriented x3, affect grossly appropriate.  Recent Labwork: No results found for requested labs within last 365 days.     Component Value Date/Time   CHOL 185 03/05/2012 1211   TRIG 128 03/05/2012 1211   HDL 61 03/05/2012 1211   CHOLHDL 3.0 03/05/2012 1211   VLDL 26 03/05/2012 1211   LDLCALC 98 03/05/2012 1211     Assessment and Plan:  Persistent A-fib with RVR: EKG today showed A-fib with RVR, HR 114 bpm.  Increase metoprolol succinate from  100 mg to 150 mg once daily and continue Eliquis 5 mg twice daily.  She has diltiazem 30 mg every 6 hours as needed prescribed by her prior cardiologist which I instructed her to take if HR more than 100 bpm.  She will be brought back for nurse visit in 1 week for better control of heart rates.  I will also obtain 2D echocardiogram.  Valvular heart disease (moderate MR, moderate to severe TR in 2022): Has mild SOB, on Lasix 20 mg once daily, instructed her to take an additional dose of Lasix for SOB.  Update 2D echocardiogram.  Cardiac chest pain: Ongoing chest pains for quite a while (many years), especially  with overexertion and last for couple of minutes.  NM stress test from 2022 showed no evidence of ischemia or scar.  Frequency couple of times a month.  Stable angina.  Will prescribe SL NTG 0.4 mg as needed.  If becomes more frequent, needs medical management versus stress test.    Medication Adjustments/Labs and Tests Ordered: Current medicines are reviewed at length with the patient today.  Concerns regarding medicines are outlined above.    Disposition:  Follow up  6 months with me, 1 week nurse visit for EKG  Signed Jakyra Kenealy Verne Spurr, MD, 11/20/2022 2:30 PM    [facility] at [facility] [address] [place], [place], [place] [address]

## 2022-11-20 NOTE — Patient Instructions (Addendum)
Medication Instructions:  Your physician has recommended you make the following change in your medication:  Stop taking aspirin  Increase Metoprolol succinate from 100 mg to 150 mg once daily Start taking Nitroglycerin 0.4 mg Dissolve one under tongue for chest pain every 5 minutes up to 3 doses. If no relief, proceed to ED.   Labwork: None  Testing/Procedures: Your physician has requested that you have an echocardiogram. Echocardiography is a painless test that uses sound waves to create images of your heart. It provides your doctor with information about the size and shape of your heart and how well your heart's chambers and valves are working. This procedure takes approximately one hour. There are no restrictions for this procedure. Please do NOT wear cologne, perfume, aftershave, or lotions (deodorant is allowed). Please arrive 15 minutes prior to your appointment time.   Follow-Up: Your physician recommends that you schedule a follow-up appointment in: 1 week nurse visit then 6 months  Any Other Special Instructions Will Be Listed Below (If Applicable).  If you need a refill on your cardiac medications before your next appointment, please call your pharmacy.

## 2022-11-27 ENCOUNTER — Ambulatory Visit: Payer: Medicare HMO | Attending: Cardiology | Admitting: *Deleted

## 2022-11-27 DIAGNOSIS — I4891 Unspecified atrial fibrillation: Secondary | ICD-10-CM

## 2022-11-27 NOTE — Progress Notes (Signed)
Patient if office today for EKG.  No c/o any symptoms this morning.

## 2022-12-03 ENCOUNTER — Telehealth: Payer: Self-pay

## 2022-12-03 MED ORDER — DILTIAZEM HCL 60 MG PO TABS: 60.0000 mg | ORAL_TABLET | Freq: Three times a day (TID) | ORAL | 3 refills | Status: DC

## 2022-12-03 NOTE — Telephone Encounter (Signed)
-----   Message from Vishnu P Mallipeddi sent at 12/03/2022  8:47 AM EDT ----- EKG shows atrial fibrillation with RVR.  Continue current dose of metoprolol and add diltiazem 60 mg every 8 hours (this will be scheduled medication).  Schedule nurse visit in 1 week for EKG.

## 2022-12-03 NOTE — Telephone Encounter (Signed)
Patient notified and verbalized understanding. Pt had no questions or concerns at this time

## 2022-12-08 ENCOUNTER — Ambulatory Visit: Payer: Medicare HMO | Attending: Internal Medicine

## 2022-12-08 DIAGNOSIS — I340 Nonrheumatic mitral (valve) insufficiency: Secondary | ICD-10-CM | POA: Diagnosis not present

## 2022-12-08 DIAGNOSIS — I4891 Unspecified atrial fibrillation: Secondary | ICD-10-CM

## 2022-12-09 LAB — ECHOCARDIOGRAM COMPLETE
AR max vel: 2.87 cm2
AV Area VTI: 2.98 cm2
AV Area mean vel: 2.75 cm2
AV Mean grad: 1 mm[Hg]
AV Peak grad: 2.3 mm[Hg]
Ao pk vel: 0.76 m/s
Calc EF: 52.3 %
Est EF: 50
S' Lateral: 2.6 cm
Single Plane A2C EF: 49.7 %
Single Plane A4C EF: 52.4 %

## 2022-12-10 ENCOUNTER — Ambulatory Visit: Payer: Medicare HMO | Attending: Internal Medicine

## 2022-12-10 VITALS — BP 100/60 | HR 88 | Ht 66.0 in | Wt 147.2 lb

## 2022-12-10 DIAGNOSIS — I4819 Other persistent atrial fibrillation: Secondary | ICD-10-CM

## 2022-12-10 NOTE — Progress Notes (Signed)
Patient presents today for a nurse visit per Dr.Mallipeddi  Patient states no weakness, SOB, or dizziness   Vitals were taken BP L 110/60 SPO2 94% HR 88 BP R 100/60  EKG preformed  Patient does not think she is taking Diltiazem

## 2022-12-10 NOTE — Patient Instructions (Addendum)
Medication Instructions:  Your physician recommends that you continue on your current medications as directed. Please refer to the Current Medication list given to you today.  Labwork: None  Testing/Procedures: None   Follow-Up: Your physician recommends that you schedule a follow-up appointment in: 05/26/23   Any Other Special Instructions Will Be Listed Below (If Applicable).  If you need a refill on your cardiac medications before your next appointment, please call your pharmacy.

## 2022-12-22 ENCOUNTER — Telehealth: Payer: Self-pay

## 2022-12-22 NOTE — Telephone Encounter (Signed)
-----   Message from Vishnu P Mallipeddi sent at 12/12/2022 10:49 AM EDT ----- Normal pumping function of the heart, 50% (55% and appears normal), mild to moderate TR.  Continue current medications.

## 2022-12-22 NOTE — Telephone Encounter (Signed)
Patient informed and verbalized understanding of plan.

## 2022-12-25 ENCOUNTER — Telehealth: Payer: Self-pay | Admitting: Cardiology

## 2022-12-25 DIAGNOSIS — E113393 Type 2 diabetes mellitus with moderate nonproliferative diabetic retinopathy without macular edema, bilateral: Secondary | ICD-10-CM | POA: Diagnosis not present

## 2022-12-25 NOTE — Telephone Encounter (Signed)
Antoine Poche, MD  Zachary George T That is fine  Dominga Ferry MD       Previous Messages    ----- Message ----- From: Marjo Bicker, MD Sent: 12/15/2022   4:17 PM EDT To: Megan Salon; Vallarie Mare; * Subject: RE: TRANSFER OF DOCTORS                        Okay with me. ----- Message ----- From: Zachary George T Sent: 12/10/2022  11:02 AM EDT To: Vallarie Mare; Antoine Poche, MD; * Subject: TRANSFER OF DOCTORS                            [name] has requested to see if she can transfer her care to Dr.Branch.

## 2023-01-14 DIAGNOSIS — K921 Melena: Secondary | ICD-10-CM | POA: Diagnosis not present

## 2023-01-14 DIAGNOSIS — I10 Essential (primary) hypertension: Secondary | ICD-10-CM | POA: Diagnosis not present

## 2023-01-14 DIAGNOSIS — Z6824 Body mass index (BMI) 24.0-24.9, adult: Secondary | ICD-10-CM | POA: Diagnosis not present

## 2023-01-16 ENCOUNTER — Encounter (INDEPENDENT_AMBULATORY_CARE_PROVIDER_SITE_OTHER): Payer: Self-pay | Admitting: *Deleted

## 2023-01-16 DIAGNOSIS — E1169 Type 2 diabetes mellitus with other specified complication: Secondary | ICD-10-CM | POA: Diagnosis not present

## 2023-01-16 DIAGNOSIS — N952 Postmenopausal atrophic vaginitis: Secondary | ICD-10-CM | POA: Diagnosis not present

## 2023-01-16 DIAGNOSIS — K921 Melena: Secondary | ICD-10-CM | POA: Diagnosis not present

## 2023-01-16 DIAGNOSIS — N1831 Chronic kidney disease, stage 3a: Secondary | ICD-10-CM | POA: Diagnosis not present

## 2023-01-16 DIAGNOSIS — I480 Paroxysmal atrial fibrillation: Secondary | ICD-10-CM | POA: Diagnosis not present

## 2023-01-16 DIAGNOSIS — I10 Essential (primary) hypertension: Secondary | ICD-10-CM | POA: Diagnosis not present

## 2023-01-16 DIAGNOSIS — I5032 Chronic diastolic (congestive) heart failure: Secondary | ICD-10-CM | POA: Diagnosis not present

## 2023-01-19 ENCOUNTER — Ambulatory Visit (INDEPENDENT_AMBULATORY_CARE_PROVIDER_SITE_OTHER): Payer: Medicare HMO | Admitting: Gastroenterology

## 2023-01-19 ENCOUNTER — Encounter (INDEPENDENT_AMBULATORY_CARE_PROVIDER_SITE_OTHER): Payer: Self-pay | Admitting: Gastroenterology

## 2023-01-19 VITALS — BP 113/75 | HR 78 | Temp 97.8°F | Ht 66.0 in | Wt 149.3 lb

## 2023-01-19 DIAGNOSIS — Z800 Family history of malignant neoplasm of digestive organs: Secondary | ICD-10-CM | POA: Diagnosis not present

## 2023-01-19 DIAGNOSIS — K921 Melena: Secondary | ICD-10-CM | POA: Insufficient documentation

## 2023-01-19 DIAGNOSIS — Z860100 Personal history of colon polyps, unspecified: Secondary | ICD-10-CM | POA: Diagnosis not present

## 2023-01-19 MED ORDER — CLENPIQ 10-3.5-12 MG-GM -GM/175ML PO SOLN: 1.0000 | ORAL | 0 refills | Status: DC

## 2023-01-19 NOTE — Patient Instructions (Signed)
It was very nice to meet you today, as dicussed with will plan for the following :  1) Upper endoscopy and colonoscopy

## 2023-01-19 NOTE — H&P (View-Only) (Signed)
Jill Fisher , M.D. Gastroenterology & Hepatology [facility] Gastroenterology [address] [place], [place] [address] Primary Care Physician: Toma Deiters, MD [address] [place] [address]  Chief Complaint:  Hematochezia/Melena   History of Present Illness:  Jill Fisher is a 88 y.o. 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  She has not seen any blood since.  Patient reports 1 year ago she had similar episode with blood per rectum which self resolved.  Patient denies any constipation dysphagia or abdominal pain. The patient denies having any nausea, vomiting, fever, chills, hematochezia, melena, hematemesis, abdominal distention, abdominal pain, diarrhea, jaundice, pruritus or weight loss.  Last UJW:JXBJ Last Colonoscopy:2014  Examination performed to cecum. Pancolonic diverticulosis. Five small polyps ablated via cold biopsy from ascending colon and submitted together.  Patient had 5 small polyps removed from your ascending colon and and 3 are possibly adenomas Next colonoscopy in 5 years  FHx: Patient reports 3 of her sisters in 45s had colon cancer Social: neg smoking, alcohol or illicit drug use Surgical: Appendectomy, cholecystectomy  Past Medical History: Past Medical History:  Diagnosis Date   A-fib (HCC)    Allergic arthritis of shoulder region, right    Cerebrovascular disease    followed at [facility]   Coronary artery disease    LVEF  65%   Diabetes mellitus without complication (HCC)    Type II   GERD (gastroesophageal reflux disease)    Hyperlipidemia    Hypertension     Past Surgical History: Past Surgical  History:  Procedure Laterality Date   APPENDECTOMY     CARDIAC CATHETERIZATION     CAROTID ENDARTERECTOMY     left   CAROTID STENT INSERTION Left    CATARACT EXTRACTION W/PHACO Right 11/30/2012   Procedure: CATARACT EXTRACTION PHACO AND INTRAOCULAR LENS PLACEMENT (IOC);  Surgeon: Loraine Leriche T. Nile Riggs, MD;  Location: [facility];  Service: Ophthalmology;  Laterality: Right;  CDE:12.71   CATARACT EXTRACTION W/PHACO Left 12/21/2012   Procedure: CATARACT EXTRACTION PHACO AND INTRAOCULAR LENS PLACEMENT (IOC);  Surgeon: Loraine Leriche T. Nile Riggs, MD;  Location: [facility];  Service: Ophthalmology;  Laterality: Left;  CDE:12.65   CHOLECYSTECTOMY     COLONOSCOPY N/A 06/29/2012   Procedure: COLONOSCOPY;  Surgeon: Malissa Hippo, MD;  [facility]: [facility];  Service: Endoscopy;  Laterality: N/A;  1200-moved to 1215 [name] to notify pt   REVERSE SHOULDER ARTHROPLASTY Right 09/03/2017   Procedure: REVERSE RIGHT SHOULDER ARTHROPLASTY;  Surgeon: Francena Hanly, MD;  Location: [facility] OR;  Service: Orthopedics;  Laterality: Right;   YAG LASER APPLICATION Right 07/03/2015   Procedure: YAG LASER APPLICATION;  Surgeon: Jethro Bolus, MD;  Location: [facility];  Service: Ophthalmology;  Laterality: Right;   YAG LASER APPLICATION Left 07/17/2015   Procedure: YAG LASER APPLICATION;  Surgeon: Jethro Bolus, MD;  Location: [facility];  Service: Ophthalmology;  Laterality: Left;    Family History: Family History  Problem Relation Age of Onset   Heart disease Mother    Cerebrovascular Disease Father    Cancer Sister    Cerebrovascular Disease Brother    Diabetes Brother    Heart attack Brother  Liver disease Brother     Social History: Social History   Tobacco Use  Smoking Status Never  Smokeless Tobacco Never   Social History   Substance and Sexual Activity  Alcohol Use No   Social History   Substance and Sexual Activity  Drug Use No    Allergies: Allergies  Allergen Reactions   Codeine Anxiety and Other (See Comments)     Jitters     Medications: Current Outpatient Medications  Medication Sig Dispense Refill   alendronate (FOSAMAX) 70 MG tablet Take 70 mg by mouth once a week.     aspirin EC 81 MG tablet Take 81 mg by mouth daily. Swallow whole.     atorvastatin (LIPITOR) 20 MG tablet Take 20 mg by mouth at bedtime.     furosemide (LASIX) 20 MG tablet Take 20 mg by mouth daily.     gabapentin (NEURONTIN) 300 MG capsule Take 300 mg by mouth 2 (two) times daily.     glimepiride (AMARYL) 1 MG tablet Take 1 mg by mouth daily.     losartan (COZAAR) 25 MG tablet Take 25 mg by mouth daily.     metFORMIN (GLUCOPHAGE) 1000 MG tablet Take 1,000 mg by mouth 2 (two) times daily with a meal.      metoprolol succinate (TOPROL XL) 100 MG 24 hr tablet Take 1.5 tablets (150 mg total) by mouth daily. Take with or immediately following a meal. 135 tablet 2   nitroGLYCERIN (NITROSTAT) 0.4 MG SL tablet Place 1 tablet (0.4 mg total) under the tongue every 5 (five) minutes x 3 doses as needed for chest pain (if no relief after 3rd does proceed to ED or call 911). 25 tablet 2   Sod Picosulfate-Mag Ox-Cit Acd (CLENPIQ) 10-3.5-12 MG-GM -GM/175ML SOLN Take 1 kit by mouth as directed. 350 mL 0   apixaban (ELIQUIS) 5 MG TABS tablet Take 1 tablet by mouth 2 (two) times daily. (Patient not taking: Reported on 01/19/2023)     diltiazem (CARDIZEM) 60 MG tablet Take 1 tablet (60 mg total) by mouth every 8 (eight) hours. (Patient not taking: Reported on 01/19/2023) 270 tablet 3   No current facility-administered medications for this visit.    Review of Systems: GENERAL: negative for malaise, night sweats HEENT: No changes in hearing or vision, no nose bleeds or other nasal problems. NECK: Negative for lumps, goiter, pain and significant neck swelling RESPIRATORY: Negative for cough, wheezing CARDIOVASCULAR: Negative for chest pain, leg swelling, palpitations, orthopnea GI: SEE HPI MUSCULOSKELETAL: Negative for joint pain or swelling, back  pain, and muscle pain. SKIN: Negative for lesions, rash HEMATOLOGY Negative for prolonged bleeding, bruising easily, and swollen nodes. ENDOCRINE: Negative for cold or heat intolerance, polyuria, polydipsia and goiter. NEURO: negative for tremor, gait imbalance, syncope and seizures. The remainder of the review of systems is noncontributory.   Physical Exam: BP 113/75 (BP Location: Left Arm, Patient Position: Sitting, Cuff Size: Normal)   Pulse 78   Temp 97.8 F (36.6 C) (Oral)   Ht 5\' 6"  (1.676 m)   Wt 149 lb 4.8 oz (67.7 kg)   BMI 24.10 kg/m  GENERAL: The patient is AO x3, in no acute distress. HEENT: Head is normocephalic and atraumatic. EOMI are intact. Mouth is well hydrated and without lesions. NECK: Supple. No masses LUNGS: Clear to auscultation. No presence of rhonchi/wheezing/rales. Adequate chest expansion HEART: RRR, normal s1 and s2. ABDOMEN: Soft, nontender, no guarding, no peritoneal signs, and nondistended. BS +. No masses.  Imaging/Labs: as above     Latest Ref Rng & Units 12/06/2020    1:10 PM 08/27/2017   11:16 AM 06/06/2016    4:32 PM  CBC  WBC 4.0 - 10.5 K/uL 8.1  9.6  10.1   Hemoglobin 12.0 - 15.0 g/dL 09.3  23.5  57.3   Hematocrit 36.0 - 46.0 % 42.2  41.4  38.1   Platelets 150 - 400 K/uL 182  255  228    No results found for: "IRON", "TIBC", "FERRITIN"  I personally reviewed and interpreted the available labs, imaging and endoscopic files.  Last labs from 01/16/2023 baseline hemoglobin 13--->12.5 MCV 90 platelet 190 BUN/creatinine ratio 18 normal liver enzymes  Impression and Plan:   Jill Fisher is a 88 y.o. 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  Blood work done the same day with stable hemoglobin and normal BUN/creatinine ratio.baseline hemoglobin 13--->12.5 MCV 90 platelet 190 BUN/creatinine ratio 18 normal liver enzymes  Although patient does report dark color stools concerning for melena as well.  Last colonoscopy 2014 suggest repeat 5 years.  Patient has family history of colon cancer with 3 sisters in her 11s as per patient had colon cancer  Had an extensive discussion with patient regarding her symptoms which could be diverticular bleed, angiectasia, peptic ulcer disease or hemorrhoidal.  But given advanced age and family history of colon cancer malignancy cannot be excluded with endoscopic evaluation.   After risk-benefit, limitation discussion patient is agreeable to proceed with diagnostic upper endoscopy and colonoscopy  I thoroughly discussed with the patient the procedure, including the risks involved. Patient understands what the procedure involves including the benefits and any risks. Patient understands alternatives to the proposed procedure. Risks including (but not limited to) bleeding, tearing of the lining (perforation), rupture of adjacent organs, problems with heart and lung function, infection, and medication reactions. A small percentage of complications may require surgery, hospitalization, repeat endoscopic procedure, and/or transfusion.  Patient understood and agreed.   Will plan for upper endoscopy and colonoscopy with next available on priority list  Also patient given ER precautions, if any further significant bleeding is encountered to go to the ER  All questions were answered.      Jill Lawman, MD Gastroenterology and Hepatology [facility] Gastroenterology   This chart has been completed using [facility] Dictation software, and while attempts have been made to ensure accuracy , certain words and phrases may not be transcribed as intended

## 2023-01-19 NOTE — Progress Notes (Signed)
Vista Lawman , M.D. Gastroenterology & Hepatology [facility] Gastroenterology [address] [place], [place] [address] Primary Care Physician: Toma Deiters, MD [address] [place] [address]  Chief Complaint:  Hematochezia/Melena   History of Present Illness:  ROSILYN Fisher is a 88 y.o. 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  She has not seen any blood since.  Patient reports 1 year ago she had similar episode with blood per rectum which self resolved.  Patient denies any constipation dysphagia or abdominal pain. The patient denies having any nausea, vomiting, fever, chills, hematochezia, melena, hematemesis, abdominal distention, abdominal pain, diarrhea, jaundice, pruritus or weight loss.  Last UJW:JXBJ Last Colonoscopy:2014  Examination performed to cecum. Pancolonic diverticulosis. Five small polyps ablated via cold biopsy from ascending colon and submitted together.  Patient had 5 small polyps removed from your ascending colon and and 3 are possibly adenomas Next colonoscopy in 5 years  FHx: Patient reports 3 of her sisters in 45s had colon cancer Social: neg smoking, alcohol or illicit drug use Surgical: Appendectomy, cholecystectomy  Past Medical History: Past Medical History:  Diagnosis Date   A-fib (HCC)    Allergic arthritis of shoulder region, right    Cerebrovascular disease    followed at [facility]   Coronary artery disease    LVEF  65%   Diabetes mellitus without complication (HCC)    Type II   GERD (gastroesophageal reflux disease)    Hyperlipidemia    Hypertension     Past Surgical History: Past Surgical  History:  Procedure Laterality Date   APPENDECTOMY     CARDIAC CATHETERIZATION     CAROTID ENDARTERECTOMY     left   CAROTID STENT INSERTION Left    CATARACT EXTRACTION W/PHACO Right 11/30/2012   Procedure: CATARACT EXTRACTION PHACO AND INTRAOCULAR LENS PLACEMENT (IOC);  Surgeon: Loraine Leriche T. Nile Riggs, MD;  Location: [facility];  Service: Ophthalmology;  Laterality: Right;  CDE:12.71   CATARACT EXTRACTION W/PHACO Left 12/21/2012   Procedure: CATARACT EXTRACTION PHACO AND INTRAOCULAR LENS PLACEMENT (IOC);  Surgeon: Loraine Leriche T. Nile Riggs, MD;  Location: [facility];  Service: Ophthalmology;  Laterality: Left;  CDE:12.65   CHOLECYSTECTOMY     COLONOSCOPY N/A 06/29/2012   Procedure: COLONOSCOPY;  Surgeon: Malissa Hippo, MD;  [facility];  Service: Endoscopy;  Laterality: N/A;  1200-moved to 1215 [name] to notify pt   REVERSE SHOULDER ARTHROPLASTY Right 09/03/2017   Procedure: REVERSE RIGHT SHOULDER ARTHROPLASTY;  Surgeon: Francena Hanly, MD;  Location: [facility];  Service: Orthopedics;  Laterality: Right;   YAG LASER APPLICATION Right 07/03/2015   Procedure: YAG LASER APPLICATION;  Surgeon: Jethro Bolus, MD;  Location: [facility];  Service: Ophthalmology;  Laterality: Right;   YAG LASER APPLICATION Left 07/17/2015   Procedure: YAG LASER APPLICATION;  Surgeon: Jethro Bolus, MD;  Location: [facility];  Service: Ophthalmology;  Laterality: Left;    Family History: Family History  Problem Relation Age of Onset   Heart disease Mother    Cerebrovascular Disease Father    Cancer Sister    Cerebrovascular Disease Brother    Diabetes Brother    Heart attack Brother  Liver disease Brother     Social History: Social History   Tobacco Use  Smoking Status Never  Smokeless Tobacco Never   Social History   Substance and Sexual Activity  Alcohol Use No   Social History   Substance and Sexual Activity  Drug Use No    Allergies: Allergies  Allergen Reactions   Codeine Anxiety and Other (See Comments)     Jitters     Medications: Current Outpatient Medications  Medication Sig Dispense Refill   alendronate (FOSAMAX) 70 MG tablet Take 70 mg by mouth once a week.     aspirin EC 81 MG tablet Take 81 mg by mouth daily. Swallow whole.     atorvastatin (LIPITOR) 20 MG tablet Take 20 mg by mouth at bedtime.     furosemide (LASIX) 20 MG tablet Take 20 mg by mouth daily.     gabapentin (NEURONTIN) 300 MG capsule Take 300 mg by mouth 2 (two) times daily.     glimepiride (AMARYL) 1 MG tablet Take 1 mg by mouth daily.     losartan (COZAAR) 25 MG tablet Take 25 mg by mouth daily.     metFORMIN (GLUCOPHAGE) 1000 MG tablet Take 1,000 mg by mouth 2 (two) times daily with a meal.      metoprolol succinate (TOPROL XL) 100 MG 24 hr tablet Take 1.5 tablets (150 mg total) by mouth daily. Take with or immediately following a meal. 135 tablet 2   nitroGLYCERIN (NITROSTAT) 0.4 MG SL tablet Place 1 tablet (0.4 mg total) under the tongue every 5 (five) minutes x 3 doses as needed for chest pain (if no relief after 3rd does proceed to ED or call 911). 25 tablet 2   Sod Picosulfate-Mag Ox-Cit Acd (CLENPIQ) 10-3.5-12 MG-GM -GM/175ML SOLN Take 1 kit by mouth as directed. 350 mL 0   apixaban (ELIQUIS) 5 MG TABS tablet Take 1 tablet by mouth 2 (two) times daily. (Patient not taking: Reported on 01/19/2023)     diltiazem (CARDIZEM) 60 MG tablet Take 1 tablet (60 mg total) by mouth every 8 (eight) hours. (Patient not taking: Reported on 01/19/2023) 270 tablet 3   No current facility-administered medications for this visit.    Review of Systems: GENERAL: negative for malaise, night sweats HEENT: No changes in hearing or vision, no nose bleeds or other nasal problems. NECK: Negative for lumps, goiter, pain and significant neck swelling RESPIRATORY: Negative for cough, wheezing CARDIOVASCULAR: Negative for chest pain, leg swelling, palpitations, orthopnea GI: SEE HPI MUSCULOSKELETAL: Negative for joint pain or swelling, back  pain, and muscle pain. SKIN: Negative for lesions, rash HEMATOLOGY Negative for prolonged bleeding, bruising easily, and swollen nodes. ENDOCRINE: Negative for cold or heat intolerance, polyuria, polydipsia and goiter. NEURO: negative for tremor, gait imbalance, syncope and seizures. The remainder of the review of systems is noncontributory.   Physical Exam: BP 113/75 (BP Location: Left Arm, Patient Position: Sitting, Cuff Size: Normal)   Pulse 78   Temp 97.8 F (36.6 C) (Oral)   Ht 5\' 6"  (1.676 m)   Wt 149 lb 4.8 oz (67.7 kg)   BMI 24.10 kg/m  GENERAL: The patient is AO x3, in no acute distress. HEENT: Head is normocephalic and atraumatic. EOMI are intact. Mouth is well hydrated and without lesions. NECK: Supple. No masses LUNGS: Clear to auscultation. No presence of rhonchi/wheezing/rales. Adequate chest expansion HEART: RRR, normal s1 and s2. ABDOMEN: Soft, nontender, no guarding, no peritoneal signs, and nondistended. BS +. No masses.  Imaging/Labs: as above     Latest Ref Rng & Units 12/06/2020    1:10 PM 08/27/2017   11:16 AM 06/06/2016    4:32 PM  CBC  WBC 4.0 - 10.5 K/uL 8.1  9.6  10.1   Hemoglobin 12.0 - 15.0 g/dL 09.3  23.5  57.3   Hematocrit 36.0 - 46.0 % 42.2  41.4  38.1   Platelets 150 - 400 K/uL 182  255  228    No results found for: "IRON", "TIBC", "FERRITIN"  I personally reviewed and interpreted the available labs, imaging and endoscopic files.  Last labs from 01/16/2023 baseline hemoglobin 13--->12.5 MCV 90 platelet 190 BUN/creatinine ratio 18 normal liver enzymes  Impression and Plan:   JAICEE WALLMAN is a 88 y.o. 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  Blood work done the same day with stable hemoglobin and normal BUN/creatinine ratio.baseline hemoglobin 13--->12.5 MCV 90 platelet 190 BUN/creatinine ratio 18 normal liver enzymes  Although patient does report dark color stools concerning for melena as well.  Last colonoscopy 2014 suggest repeat 5 years.  Patient has family history of colon cancer with 3 sisters in her 11s as per patient had colon cancer  Had an extensive discussion with patient regarding her symptoms which could be diverticular bleed, angiectasia, peptic ulcer disease or hemorrhoidal.  But given advanced age and family history of colon cancer malignancy cannot be excluded with endoscopic evaluation.   After risk-benefit, limitation discussion patient is agreeable to proceed with diagnostic upper endoscopy and colonoscopy  I thoroughly discussed with the patient the procedure, including the risks involved. Patient understands what the procedure involves including the benefits and any risks. Patient understands alternatives to the proposed procedure. Risks including (but not limited to) bleeding, tearing of the lining (perforation), rupture of adjacent organs, problems with heart and lung function, infection, and medication reactions. A small percentage of complications may require surgery, hospitalization, repeat endoscopic procedure, and/or transfusion.  Patient understood and agreed.   Will plan for upper endoscopy and colonoscopy with next available on priority list  Also patient given ER precautions, if any further significant bleeding is encountered to go to the ER  All questions were answered.      Vista Lawman, MD Gastroenterology and Hepatology [facility] Gastroenterology   This chart has been completed using [facility] Dictation software, and while attempts have been made to ensure accuracy , certain words and phrases may not be transcribed as intended

## 2023-01-20 ENCOUNTER — Telehealth (INDEPENDENT_AMBULATORY_CARE_PROVIDER_SITE_OTHER): Payer: Self-pay | Admitting: Gastroenterology

## 2023-01-20 NOTE — Telephone Encounter (Signed)
Pt returned call and was made aware of pre op appt.

## 2023-01-20 NOTE — Telephone Encounter (Signed)
Left detailed message with pre op appt. Pre op scheduled for 01/21/23 at 8:30am

## 2023-01-20 NOTE — Patient Instructions (Addendum)
Jill Fisher  01/20/2023     @PREFPERIOPPHARMACY @   Your procedure is scheduled on  01/22/2023.   Report to [facility] at  0730  A.M.   Call this number if you have problems the morning of surgery:  [phone]  If you experience any cold or flu symptoms such as cough, fever, chills, shortness of breath, etc. between now and your scheduled surgery, please notify us at the above number.   Remember:     DO NOT take any medications for diabetes the morning of your procedure.    Follow the diet and prep instructions given to you by the office.    You may drink clear liquids until 0530 am on 01/22/2023.    Clear liquids allowed are:                    Water, Juice (No red color; non-citric and without pulp; diabetics please choose diet or no sugar options), Carbonated beverages (diabetics please choose diet or no sugar options), Clear Tea (No creamer, milk, or cream, including half & half and powdered creamer), Black Coffee Only (No creamer, milk or cream, including half & half and powdered creamer), and Clear Sports drink (No red color; diabetics please choose diet or no sugar options)    Take these medicines the morning of surgery with A SIP OF WATER                                               metoprolol.    Do not wear jewelry, make-up or nail polish, including gel polish,  artificial nails, or any other type of covering on natural nails (fingers and  toes).  Do not wear lotions, powders, or perfumes, or deodorant.  Do not shave 48 hours prior to surgery.  Men may shave face and neck.  Do not bring valuables to the hospital.  [facility] is not responsible for any belongings or valuables.  Contacts, dentures or bridgework may not be worn into surgery.  Leave your suitcase in the car.  After surgery it may be brought to your room.  For patients admitted to the hospital, discharge time will be determined by your treatment team.  Patients discharged the day of  surgery will not be allowed to drive home.    Special instructions:   DO NOT smoke tobacco or vape for 24 hours before your procedure  Please read over the following fact sheets that you were given. Anesthesia Post-op Instructions and Care and Recovery After Surgery     Upper Endoscopy, Adult, Care After After the procedure, it is common to have a sore throat. It is also common to have: Mild stomach pain or discomfort. Bloating. Nausea. Follow these instructions at home: The instructions below may help you care for yourself at home. Your health care provider may give you more instructions. If you have questions, ask your health care provider. If you were given a sedative during the procedure, it can affect you for several hours. Do not drive or operate machinery until your health care provider says that it is safe. If you will be going home right after the procedure, plan to have a responsible adult: Take you home from the hospital or clinic. You will not be allowed to drive. Care for you for the time you are  told. Follow instructions from your health care provider about what you may eat and drink. Return to your normal activities as told by your health care provider. Ask your health care provider what activities are safe for you. Take over-the-counter and prescription medicines only as told by your health care provider. Contact a health care provider if you: Have a sore throat that lasts longer than one day. Have trouble swallowing. Have a fever. Get help right away if you: Vomit blood or your vomit looks like coffee grounds. Have bloody, black, or tarry stools. Have a very bad sore throat or you cannot swallow. Have difficulty breathing or very bad pain in your chest or abdomen. These symptoms may be an emergency. Get help right away. Call 911. Do not wait to see if the symptoms will go away. Do not drive yourself to the hospital. Summary After the procedure, it is common to  have a sore throat, mild stomach discomfort, bloating, and nausea. If you were given a sedative during the procedure, it can affect you for several hours. Do not drive until your health care provider says that it is safe. Follow instructions from your health care provider about what you may eat and drink. Return to your normal activities as told by your health care provider. This information is not intended to replace advice given to you by your health care provider. Make sure you discuss any questions you have with your health care provider. Document Revised: 06/05/2021 Document Reviewed: 06/05/2021 Elsevier Patient Education  2024 Elsevier Inc.    Colonoscopy, Adult, Care After The following information offers guidance on how to care for yourself after your procedure. Your health care provider may also give you more specific instructions. If you have problems or questions, contact your health care provider. What can I expect after the procedure? After the procedure, it is common to have: A small amount of blood in your stool for 24 hours after the procedure. Some gas. Mild cramping or bloating of your abdomen. Follow these instructions at home: Eating and drinking  Drink enough fluid to keep your urine pale yellow. Follow instructions from your health care provider about eating or drinking restrictions. Resume your normal diet as told by your health care provider. Avoid heavy or fried foods that are hard to digest. Activity Rest as told by your health care provider. Avoid sitting for a long time without moving. Get up to take short walks every 1-2 hours. This is important to improve blood flow and breathing. Ask for help if you feel weak or unsteady. Return to your normal activities as told by your health care provider. Ask your health care provider what activities are safe for you. Managing cramping and bloating  Try walking around when you have cramps or feel bloated. If directed,  apply heat to your abdomen as told by your health care provider. Use the heat source that your health care provider recommends, such as a moist heat pack or a heating pad. Place a towel between your skin and the heat source. Leave the heat on for 20-30 minutes. Remove the heat if your skin turns bright red. This is especially important if you are unable to feel pain, heat, or cold. You have a greater risk of getting burned. General instructions If you were given a sedative during the procedure, it can affect you for several hours. Do not drive or operate machinery until your health care provider says that it is safe. For the first 24 hours after  the procedure: Do not sign important documents. Do not drink alcohol. Do your regular daily activities at a slower pace than normal. Eat soft foods that are easy to digest. Take over-the-counter and prescription medicines only as told by your health care provider. Keep all follow-up visits. This is important. Contact a health care provider if: You have blood in your stool 2-3 days after the procedure. Get help right away if: You have more than a small spotting of blood in your stool. You have large blood clots in your stool. You have swelling of your abdomen. You have nausea or vomiting. You have a fever. You have increasing pain in your abdomen that is not relieved with medicine. These symptoms may be an emergency. Get help right away. Call 911. Do not wait to see if the symptoms will go away. Do not drive yourself to the hospital. Summary After the procedure, it is common to have a small amount of blood in your stool. You may also have mild cramping and bloating of your abdomen. If you were given a sedative during the procedure, it can affect you for several hours. Do not drive or operate machinery until your health care provider says that it is safe. Get help right away if you have a lot of blood in your stool, nausea or vomiting, a fever, or  increased pain in your abdomen. This information is not intended to replace advice given to you by your health care provider. Make sure you discuss any questions you have with your health care provider. Document Revised: 04/08/2022 Document Reviewed: 10/17/2020 Elsevier Patient Education  2024 Elsevier Inc. Monitored Anesthesia Care, Care After The following information offers guidance on how to care for yourself after your procedure. Your health care provider may also give you more specific instructions. If you have problems or questions, contact your health care provider. What can I expect after the procedure? After the procedure, it is common to have: Tiredness. Little or no memory about what happened during or after the procedure. Impaired judgment when it comes to making decisions. Nausea or vomiting. Some trouble with balance. Follow these instructions at home: For the time period you were told by your health care provider:  Rest. Do not participate in activities where you could fall or become injured. Do not drive or use machinery. Do not drink alcohol. Do not take sleeping pills or medicines that cause drowsiness. Do not make important decisions or sign legal documents. Do not take care of children on your own. Medicines Take over-the-counter and prescription medicines only as told by your health care provider. If you were prescribed antibiotics, take them as told by your health care provider. Do not stop using the antibiotic even if you start to feel better. Eating and drinking Follow instructions from your health care provider about what you may eat and drink. Drink enough fluid to keep your urine pale yellow. If you vomit: Drink clear fluids slowly and in small amounts as you are able. Clear fluids include water, ice chips, low-calorie sports drinks, and fruit juice that has water added to it (diluted fruit juice). Eat light and bland foods in small amounts as you are able.  These foods include bananas, applesauce, rice, lean meats, toast, and crackers. General instructions  Have a responsible adult stay with you for the time you are told. It is important to have someone help care for you until you are awake and alert. If you have sleep apnea, surgery and some medicines can  increase your risk for breathing problems. Follow instructions from your health care provider about wearing your sleep device: When you are sleeping. This includes during daytime naps. While taking prescription pain medicines, sleeping medicines, or medicines that make you drowsy. Do not use any products that contain nicotine or tobacco. These products include cigarettes, chewing tobacco, and vaping devices, such as e-cigarettes. If you need help quitting, ask your health care provider. Contact a health care provider if: You feel nauseous or vomit every time you eat or drink. You feel light-headed. You are still sleepy or having trouble with balance after 24 hours. You get a rash. You have a fever. You have redness or swelling around the IV site. Get help right away if: You have trouble breathing. You have new confusion after you get home. These symptoms may be an emergency. Get help right away. Call 911. Do not wait to see if the symptoms will go away. Do not drive yourself to the hospital. This information is not intended to replace advice given to you by your health care provider. Make sure you discuss any questions you have with your health care provider. Document Revised: 07/22/2021 Document Reviewed: 07/22/2021 Elsevier Patient Education  2024 [employer].

## 2023-01-21 ENCOUNTER — Encounter (HOSPITAL_COMMUNITY)
Admission: RE | Admit: 2023-01-21 | Discharge: 2023-01-21 | Disposition: A | Discharge status: Home / Self Care | Payer: Medicare HMO | Source: Ambulatory Visit | Attending: Gastroenterology | Admitting: Gastroenterology

## 2023-01-21 ENCOUNTER — Encounter (HOSPITAL_COMMUNITY): Payer: Self-pay

## 2023-01-21 VITALS — BP 113/75 | HR 78 | Temp 97.8°F | Resp 18 | Ht 66.0 in | Wt 149.3 lb

## 2023-01-21 DIAGNOSIS — Z79899 Other long term (current) drug therapy: Secondary | ICD-10-CM | POA: Insufficient documentation

## 2023-01-21 DIAGNOSIS — Z01812 Encounter for preprocedural laboratory examination: Secondary | ICD-10-CM | POA: Insufficient documentation

## 2023-01-21 LAB — BASIC METABOLIC PANEL
Anion gap: 11 (ref 5–15)
BUN: 25 mg/dL — ABNORMAL HIGH (ref 8–23)
CO2: 24 mmol/L (ref 22–32)
Calcium: 8.2 mg/dL — ABNORMAL LOW (ref 8.9–10.3)
Chloride: 108 mmol/L (ref 98–111)
Creatinine, Ser: 1.23 mg/dL — ABNORMAL HIGH (ref 0.44–1.00)
GFR, Estimated: 43 mL/min — ABNORMAL LOW (ref >60)
Glucose, Bld: 129 mg/dL — ABNORMAL HIGH (ref 70–99)
Potassium: 3.9 mmol/L (ref 3.5–5.1)
Sodium: 143 mmol/L (ref 135–145)

## 2023-01-21 NOTE — Progress Notes (Signed)
Hi Wendy ,  Can you please call the patient and tell the patient the lab work  shows slight elevation in kidney function , I recommend continue to keep hydrated especially with upcoming colonoscopy   And follow up with primary care later to repeat kidney function blood work.  Thanks,  Vista Lawman, MD Gastroenterology and Hepatology [facility]

## 2023-01-22 ENCOUNTER — Ambulatory Visit (HOSPITAL_COMMUNITY)
Admission: RE | Admit: 2023-01-22 | Discharge: 2023-01-22 | Disposition: A | Discharge status: Home / Self Care | Payer: Medicare HMO | Attending: Gastroenterology | Admitting: Gastroenterology

## 2023-01-22 ENCOUNTER — Ambulatory Visit (HOSPITAL_COMMUNITY): Payer: Medicare HMO | Admitting: Anesthesiology

## 2023-01-22 ENCOUNTER — Encounter (HOSPITAL_COMMUNITY)
Admission: RE | Disposition: A | Discharge status: Home / Self Care | Payer: Self-pay | Source: Home / Self Care | Attending: Gastroenterology

## 2023-01-22 DIAGNOSIS — K2950 Unspecified chronic gastritis without bleeding: Secondary | ICD-10-CM | POA: Diagnosis not present

## 2023-01-22 DIAGNOSIS — I10 Essential (primary) hypertension: Secondary | ICD-10-CM | POA: Insufficient documentation

## 2023-01-22 DIAGNOSIS — K648 Other hemorrhoids: Secondary | ICD-10-CM | POA: Insufficient documentation

## 2023-01-22 DIAGNOSIS — K5730 Diverticulosis of large intestine without perforation or abscess without bleeding: Secondary | ICD-10-CM | POA: Diagnosis not present

## 2023-01-22 DIAGNOSIS — K219 Gastro-esophageal reflux disease without esophagitis: Secondary | ICD-10-CM | POA: Insufficient documentation

## 2023-01-22 DIAGNOSIS — K2970 Gastritis, unspecified, without bleeding: Secondary | ICD-10-CM

## 2023-01-22 DIAGNOSIS — K921 Melena: Secondary | ICD-10-CM | POA: Diagnosis not present

## 2023-01-22 DIAGNOSIS — K31A19 Gastric intestinal metaplasia without dysplasia, unspecified site: Secondary | ICD-10-CM | POA: Diagnosis not present

## 2023-01-22 DIAGNOSIS — D126 Benign neoplasm of colon, unspecified: Secondary | ICD-10-CM

## 2023-01-22 DIAGNOSIS — K31A0 Gastric intestinal metaplasia, unspecified: Secondary | ICD-10-CM | POA: Diagnosis not present

## 2023-01-22 DIAGNOSIS — I4819 Other persistent atrial fibrillation: Secondary | ICD-10-CM | POA: Insufficient documentation

## 2023-01-22 DIAGNOSIS — I2510 Atherosclerotic heart disease of native coronary artery without angina pectoris: Secondary | ICD-10-CM | POA: Insufficient documentation

## 2023-01-22 DIAGNOSIS — D122 Benign neoplasm of ascending colon: Secondary | ICD-10-CM | POA: Diagnosis not present

## 2023-01-22 DIAGNOSIS — K644 Residual hemorrhoidal skin tags: Secondary | ICD-10-CM | POA: Insufficient documentation

## 2023-01-22 DIAGNOSIS — Z7984 Long term (current) use of oral hypoglycemic drugs: Secondary | ICD-10-CM | POA: Diagnosis not present

## 2023-01-22 DIAGNOSIS — B9681 Helicobacter pylori [H. pylori] as the cause of diseases classified elsewhere: Secondary | ICD-10-CM | POA: Diagnosis not present

## 2023-01-22 DIAGNOSIS — K635 Polyp of colon: Secondary | ICD-10-CM | POA: Diagnosis not present

## 2023-01-22 DIAGNOSIS — D125 Benign neoplasm of sigmoid colon: Secondary | ICD-10-CM

## 2023-01-22 DIAGNOSIS — Z7901 Long term (current) use of anticoagulants: Secondary | ICD-10-CM | POA: Diagnosis not present

## 2023-01-22 DIAGNOSIS — E119 Type 2 diabetes mellitus without complications: Secondary | ICD-10-CM | POA: Insufficient documentation

## 2023-01-22 DIAGNOSIS — Z800 Family history of malignant neoplasm of digestive organs: Secondary | ICD-10-CM | POA: Insufficient documentation

## 2023-01-22 DIAGNOSIS — E785 Hyperlipidemia, unspecified: Secondary | ICD-10-CM | POA: Insufficient documentation

## 2023-01-22 DIAGNOSIS — A048 Other specified bacterial intestinal infections: Secondary | ICD-10-CM | POA: Diagnosis not present

## 2023-01-22 DIAGNOSIS — I4891 Unspecified atrial fibrillation: Secondary | ICD-10-CM | POA: Diagnosis not present

## 2023-01-22 HISTORY — PX: POLYPECTOMY: SHX5525

## 2023-01-22 HISTORY — PX: ESOPHAGOGASTRODUODENOSCOPY (EGD) WITH PROPOFOL: SHX5813

## 2023-01-22 HISTORY — PX: BIOPSY: SHX5522

## 2023-01-22 HISTORY — PX: COLONOSCOPY WITH PROPOFOL: SHX5780

## 2023-01-22 LAB — GLUCOSE, CAPILLARY: Glucose-Capillary: 137 mg/dL — ABNORMAL HIGH (ref 70–99)

## 2023-01-22 LAB — HM COLONOSCOPY

## 2023-01-22 SURGERY — ESOPHAGOGASTRODUODENOSCOPY (EGD) WITH PROPOFOL
Anesthesia: General

## 2023-01-22 MED ORDER — LACTATED RINGERS IV SOLN: INTRAVENOUS | Status: DC | PRN

## 2023-01-22 MED ORDER — PSYLLIUM 58.6 % PO PACK
1.0000 | PACK | Freq: Two times a day (BID) | ORAL | 2 refills | Status: AC
Start: 2023-01-22 — End: 2023-04-22

## 2023-01-22 MED ORDER — STERILE WATER FOR IRRIGATION IR SOLN
Status: DC | PRN
  Administered 2023-01-22: 60 mL

## 2023-01-22 MED ORDER — PROPOFOL 500 MG/50ML IV EMUL
INTRAVENOUS | Status: DC | PRN
  Administered 2023-01-22: 100 ug/kg/min via INTRAVENOUS

## 2023-01-22 MED ORDER — PHENYLEPHRINE HCL (PRESSORS) 10 MG/ML IV SOLN
INTRAVENOUS | Status: DC | PRN
  Administered 2023-01-22: 80 ug via INTRAVENOUS

## 2023-01-22 MED ORDER — METOPROLOL TARTRATE 50 MG PO TABS
150.0000 mg | ORAL_TABLET | Freq: Once | ORAL | Status: AC
  Administered 2023-01-22: 150 mg via ORAL

## 2023-01-22 MED ORDER — DILTIAZEM HCL 60 MG PO TABS
60.0000 mg | ORAL_TABLET | Freq: Once | ORAL | Status: AC
  Administered 2023-01-22: 60 mg via ORAL
  Filled 2023-01-22: qty 1

## 2023-01-22 MED ORDER — PROPOFOL 10 MG/ML IV BOLUS
INTRAVENOUS | Status: DC | PRN
  Administered 2023-01-22 (×2): 20 mg via INTRAVENOUS
  Administered 2023-01-22: 70 mg via INTRAVENOUS
  Administered 2023-01-22: 20 mg via INTRAVENOUS

## 2023-01-22 MED ORDER — LIDOCAINE HCL 1 % IJ SOLN
INTRAMUSCULAR | Status: DC | PRN
  Administered 2023-01-22: 50 mg via INTRADERMAL

## 2023-01-22 NOTE — Op Note (Signed)
[facility] Patient Name: Jill Fisher Procedure Date: 01/22/2023 9:00 AM MRN: [id number] Date of Birth: 1934-04-24 Attending MD: Sanjuan Dame , MD, [id number] CSN: [id number] Age: 88 Admit Type: Outpatient Procedure:                Upper GI endoscopy Indications:              Melena Providers:                Sanjuan Dame, MD, Angelica Ran Referring MD:              Medicines:                Monitored Anesthesia Care Complications:            No immediate complications. Estimated Blood Loss:     Estimated blood loss was minimal. Procedure:                Pre-Anesthesia Assessment:                           - Prior to the procedure, a History and Physical                            was performed, and patient medications and                            allergies were reviewed. The patient's tolerance of                            previous anesthesia was also reviewed. The risks                            and benefits of the procedure and the sedation                            options and risks were discussed with the patient.                            All questions were answered, and informed consent                            was obtained. Prior Anticoagulants: The patient has                            taken Eliquis (apixaban), last dose was 3 days                            prior to procedure. ASA Grade Assessment: III - A                            patient with severe systemic disease. After                            reviewing the risks and benefits, the patient was  deemed in satisfactory condition to undergo the                            procedure.                           After obtaining informed consent, the endoscope was                            passed under direct vision. Throughout the                            procedure, the patient's blood pressure, pulse, and                            oxygen saturations were monitored continuously.  The                            [id number]-H190 ([id number]) scope was introduced through the                            mouth, and advanced to the second part of duodenum.                            The upper GI endoscopy was accomplished without                            difficulty. The patient tolerated the procedure                            well. Scope In: 9:14:43 AM Scope Out: 9:19:03 AM Total Procedure Duration: 0 hours 4 minutes 20 seconds  Findings:      Moderate inflammation characterized by erythema and friability was found       in the gastric antrum. Biopsies were taken with a cold forceps for       histology.      The duodenal bulb and second portion of the duodenum were normal. Impression:               - Gastritis. Biopsied.                           - Normal duodenal bulb and second portion of the                            duodenum. Moderate Sedation:      Per Anesthesia Care Recommendation:           - Patient has a contact number available for                            emergencies. The signs and symptoms of potential                            delayed complications were discussed with the  patient. Return to normal activities tomorrow.                            Written discharge instructions were provided to the                            patient.                           - Resume previous diet.                           - Continue present medications.                           - Await pathology results.                           -Start pantoprazole 40mg  , 30 min before breakfast                            daily Procedure Code(s):        --- Professional ---                           [phone], Esophagogastroduodenoscopy, flexible,                            transoral; with biopsy, single or multiple Diagnosis Code(s):        --- Professional ---                           K29.70, Gastritis, unspecified, without bleeding                            K92.1, Melena (includes Hematochezia) CPT copyright 2022 American Medical Association. All rights reserved. The codes documented in this report are preliminary and upon coder review may  be revised to meet current compliance requirements. Sanjuan Dame, MD Sanjuan Dame, MD 01/22/2023 9:22:11 AM This report has been signed electronically. Number of Addenda: 0

## 2023-01-22 NOTE — Transfer of Care (Signed)
Immediate Anesthesia Transfer of Care Note  Patient: Jill Fisher  Procedure(s) Performed: ESOPHAGOGASTRODUODENOSCOPY (EGD) WITH PROPOFOL COLONOSCOPY WITH PROPOFOL BIOPSY POLYPECTOMY  Patient Location: Short Stay  Anesthesia Type:General  Level of Consciousness: awake  Airway & Oxygen Therapy: Patient Spontanous Breathing  Post-op Assessment: Report given to RN  Post vital signs: Reviewed and stable  Last Vitals:  Vitals Value Taken Time  BP 86/31 01/22/23 0951  Temp 36.5 C 01/22/23 0951  Pulse 75 01/22/23 0951  Resp 20 01/22/23 0951  SpO2 95 % 01/22/23 0951    Last Pain:  Vitals:   01/22/23 0910  PainSc: 0-No pain         Complications: No notable events documented.

## 2023-01-22 NOTE — Discharge Instructions (Signed)
  Discharge instructions Please read the instructions outlined below and refer to this sheet in the next few weeks. These discharge instructions provide you with general information on caring for yourself after you leave the hospital. Your doctor may also give you specific instructions. While your treatment has been planned according to the most current medical practices available, unavoidable complications occasionally occur. If you have any problems or questions after discharge, please call your doctor. ACTIVITY You may resume your regular activity but move at a slower pace for the next 24 hours.  Take frequent rest periods for the next 24 hours.  Walking will help expel (get rid of) the air and reduce the bloated feeling in your abdomen.  No driving for 24 hours (because of the anesthesia (medicine) used during the test).  You may shower.  Do not sign any important legal documents or operate any machinery for 24 hours (because of the anesthesia used during the test).  NUTRITION Drink plenty of fluids.  You may resume your normal diet.  Begin with a light meal and progress to your normal diet.  Avoid alcoholic beverages for 24 hours or as instructed by your caregiver.  MEDICATIONS You may resume your normal medications unless your caregiver tells you otherwise.  WHAT YOU CAN EXPECT TODAY You may experience abdominal discomfort such as a feeling of fullness or "gas" pains.  FOLLOW-UP Your doctor will discuss the results of your test with you.  SEEK IMMEDIATE MEDICAL ATTENTION IF ANY OF THE FOLLOWING OCCUR: Excessive nausea (feeling sick to your stomach) and/or vomiting.  Severe abdominal pain and distention (swelling).  Trouble swallowing.  Temperature over 101 F (37.8 C).  Rectal bleeding or vomiting of blood.    Ensure adequate fluid intake: Aim for 8 glasses of water daily. Follow a high fiber diet: Include foods such as dates, prunes, pears, and kiwi. Take Miralax twice a day for  the first week, then reduce to once daily thereafter.     I hope you have a great rest of your week!   Vista Lawman , M.D.. Gastroenterology and Hepatology [facility]

## 2023-01-22 NOTE — Anesthesia Postprocedure Evaluation (Signed)
Anesthesia Post Note  Patient: MAKAYA LASCALA  Procedure(s) Performed: ESOPHAGOGASTRODUODENOSCOPY (EGD) WITH PROPOFOL COLONOSCOPY WITH PROPOFOL BIOPSY POLYPECTOMY  Patient location during evaluation: Short Stay Anesthesia Type: General Level of consciousness: awake and alert Pain management: pain level controlled Vital Signs Assessment: post-procedure vital signs reviewed and stable Respiratory status: spontaneous breathing Cardiovascular status: blood pressure returned to baseline and stable Postop Assessment: no apparent nausea or vomiting Anesthetic complications: no   No notable events documented.   Last Vitals:  Vitals:   01/22/23 0845 01/22/23 0951  BP:  (!) 86/31  Pulse: 77 75  Resp: 19 20  Temp:  36.5 C  SpO2: 92% 95%    Last Pain:  Vitals:   01/22/23 0910  PainSc: 0-No pain                 Caniyah Murley

## 2023-01-22 NOTE — Interval H&P Note (Signed)
History and Physical Interval Note:  01/22/2023 8:16 AM  Jill Fisher  has presented today for surgery, with the diagnosis of HEMATOCHEZIA, MELENA.  The various methods of treatment have been discussed with the patient and family. After consideration of risks, benefits and other options for treatment, the patient has consented to  Procedure(s) with comments: ESOPHAGOGASTRODUODENOSCOPY (EGD) WITH PROPOFOL (N/A) - 9:30AM;ASA 3 COLONOSCOPY WITH PROPOFOL (N/A) - 9:30AM;ASA 3 as a surgical intervention.  The patient's history has been reviewed, patient examined, no change in status, stable for surgery.  I have reviewed the patient's chart and labs.  Questions were answered to the patient's satisfaction.     Juanetta Beets Bralyn Folkert

## 2023-01-22 NOTE — Op Note (Signed)
[facility] Patient Name: Jill Fisher Procedure Date: 01/22/2023 9:00 AM MRN: [id number] Date of Birth: 1934/10/23 Attending MD: Sanjuan Dame , MD, [id number] CSN: [id number] Age: 88 Admit Type: Outpatient Procedure:                Colonoscopy Indications:              Melena Providers:                Sanjuan Dame, MD, Angelica Ran, Dyann Ruddle Referring MD:              Medicines:                Monitored Anesthesia Care Complications:            No immediate complications. Estimated Blood Loss:     Estimated blood loss was minimal. Procedure:                Pre-Anesthesia Assessment:                           - Prior to the procedure, a History and Physical                            was performed, and patient medications and                            allergies were reviewed. The patient's tolerance of                            previous anesthesia was also reviewed. The risks                            and benefits of the procedure and the sedation                            options and risks were discussed with the patient.                            All questions were answered, and informed consent                            was obtained. Prior Anticoagulants: The patient has                            taken Eliquis (apixaban), last dose was 3 days                            prior to procedure. ASA Grade Assessment: III - A                            patient with severe systemic disease. After                            reviewing the risks and benefits, the patient was  deemed in satisfactory condition to undergo the                            procedure.                           After obtaining informed consent, the colonoscope                            was passed under direct vision. Throughout the                            procedure, the patient's blood pressure, pulse, and                            oxygen saturations were monitored  continuously. The                            [id number]) scope was introduced through the                            anus and advanced to the the terminal ileum. The                            colonoscopy was performed without difficulty. The                            patient tolerated the procedure well. The quality                            of the bowel preparation was evaluated using the                            BBPS [facility] Bowel Preparation Scale) with scores                            of: Right Colon = 3, Transverse Colon = 3 and Left                            Colon = 3 (entire mucosa seen well with no residual                            staining, small fragments of stool or opaque                            liquid). The total BBPS score equals 9. The                            terminal ileum, ileocecal valve, appendiceal                            orifice, and rectum were photographed. Scope In: 9:23:43 AM Scope Out: 9:43:40 AM Scope Withdrawal Time: 0 hours 10 minutes 39 seconds  Total Procedure Duration: 0 hours 19 minutes 57 seconds  Findings:      Two sessile polyps were found in the ascending colon. The polyps were 3       to 6 mm in size. These polyps were removed with a cold snare. Resection       and retrieval were complete.      Scattered large-mouthed diverticula were found in the entire colon.      The terminal ileum appeared normal.      There is no endoscopic evidence of bleeding in the entire colon.      Non-bleeding external and internal hemorrhoids were found during       retroflexion. The hemorrhoids were medium-sized. Impression:               - Two 3 to 6 mm polyps in the ascending colon,                            removed with a cold snare. Resected and retrieved.                           - Diverticulosis in the entire examined colon.                           - The examined portion of the ileum was normal.                           - Non-bleeding  external and internal hemorrhoids.                           -Likely cause of self limiting GI bleed in this                            case was diverticular bleed Moderate Sedation:      Per Anesthesia Care Recommendation:           - Patient has a contact number available for                            emergencies. The signs and symptoms of potential                            delayed complications were discussed with the                            patient. Return to normal activities tomorrow.                            Written discharge instructions were provided to the                            patient.                           - Resume previous diet.                           - Continue  present medications.                           - Await pathology results.                           - No repeat colonoscopy due to current age (65                            years or older).                           -High fiber diet : Miralax Twice daily Procedure Code(s):        --- Professional ---                           [phone], Colonoscopy, flexible; with removal of                            tumor(s), polyp(s), or other lesion(s) by snare                            technique Diagnosis Code(s):        --- Professional ---                           D12.2, Benign neoplasm of ascending colon                           K64.8, Other hemorrhoids                           K92.1, Melena (includes Hematochezia)                           K57.30, Diverticulosis of large intestine without                            perforation or abscess without bleeding CPT copyright 2022 American Medical Association. All rights reserved. The codes documented in this report are preliminary and upon coder review may  be revised to meet current compliance requirements. Sanjuan Dame, MD Sanjuan Dame, MD 01/22/2023 9:48:03 AM This report has been signed electronically. Number of Addenda: 0

## 2023-01-22 NOTE — Anesthesia Preprocedure Evaluation (Addendum)
Anesthesia Evaluation    Airway Mallampati: II  TM Distance: >3 FB Neck ROM: Full    Dental  (+) Partial Upper, Dental Advisory Given, Missing A couple of missing upper teeth:   Pulmonary neg pulmonary ROS   Pulmonary exam normal breath sounds clear to auscultation       Cardiovascular hypertension, + CAD  Normal cardiovascular exam+ dysrhythmias Atrial Fibrillation  Rhythm:Regular Rate:Normal  Echo EF 50%   Neuro/Psych Cerebrovascular disease  negative psych ROS   GI/Hepatic ,GERD  ,,  Endo/Other  diabetes, Well Controlled, Type 2    Renal/GU   negative genitourinary   Musculoskeletal  (+) Arthritis , Osteoarthritis,    Abdominal Normal abdominal exam  (+)   Peds  Hematology negative hematology ROS (+)   Anesthesia Other Findings   Reproductive/Obstetrics                             Anesthesia Physical Anesthesia Plan  ASA: 3  Anesthesia Plan: General   Post-op Pain Management: Minimal or no pain anticipated   Induction: Intravenous  PONV Risk Score and Plan: Propofol infusion  Airway Management Planned: Nasal Cannula and Natural Airway  Additional Equipment: None  Intra-op Plan:   Post-operative Plan:   Informed Consent: I have reviewed the patients History and Physical, chart, labs and discussed the procedure including the risks, benefits and alternatives for the proposed anesthesia with the patient or authorized representative who has indicated his/her understanding and acceptance.     Dental advisory given  Plan Discussed with: CRNA  Anesthesia Plan Comments:        Anesthesia Quick Evaluation

## 2023-01-22 NOTE — Anesthesia Preprocedure Evaluation (Deleted)
Anesthesia Evaluation  Patient identified by MRN, date of birth, ID band Patient awake    Reviewed: Allergy & Precautions, NPO status , Patient's Chart, lab work & pertinent test results, reviewed documented beta blocker date and time   History of Anesthesia Complications Negative for: history of anesthetic complications  Airway Mallampati: II  TM Distance: >3 FB Neck ROM: Full    Dental  (+) Partial Upper, Dental Advisory Given   Pulmonary neg pulmonary ROS   Pulmonary exam normal breath sounds clear to auscultation       Cardiovascular hypertension, Pt. on medications and Pt. on home beta blockers + CAD and + Peripheral Vascular Disease  Normal cardiovascular exam+ dysrhythmias Atrial Fibrillation  Rhythm:Regular Rate:Normal     Neuro/Psych Cerebrovascular disease  negative psych ROS   GI/Hepatic Neg liver ROS,GERD  ,,  Endo/Other  diabetes, Well Controlled, Type 2    Renal/GU negative Renal ROS  negative genitourinary   Musculoskeletal  (+) Arthritis ,    Abdominal Normal abdominal exam  (+)   Peds  Hematology negative hematology ROS (+)   Anesthesia Other Findings   Reproductive/Obstetrics                             Anesthesia Physical Anesthesia Plan  ASA: 3  Anesthesia Plan: General   Post-op Pain Management: GA combined w/ Regional for post-op pain   Induction: Intravenous  PONV Risk Score and Plan: 3  Airway Management Planned: Nasal Cannula and Natural Airway  Additional Equipment: None  Intra-op Plan:   Post-operative Plan:   Informed Consent: I have reviewed the patients History and Physical, chart, labs and discussed the procedure including the risks, benefits and alternatives for the proposed anesthesia with the patient or authorized representative who has indicated his/her understanding and acceptance.     Dental advisory given  Plan Discussed with:  CRNA  Anesthesia Plan Comments:         Anesthesia Quick Evaluation

## 2023-01-23 ENCOUNTER — Encounter (INDEPENDENT_AMBULATORY_CARE_PROVIDER_SITE_OTHER): Payer: Self-pay | Admitting: *Deleted

## 2023-01-26 LAB — SURGICAL PATHOLOGY

## 2023-01-28 ENCOUNTER — Encounter (HOSPITAL_COMMUNITY): Payer: Self-pay | Admitting: Gastroenterology

## 2023-01-30 NOTE — Progress Notes (Signed)
I reviewed the pathology results. [name], can you send her a letter with the findings as described below please?  I spoke to the patient over the phone as well   Repeat colonoscopy : none   Thanks,  Vista Lawman, MD Gastroenterology and Hepatology [facility]  ---------------------------------------------------------------------------------------------  [facility] [address], [place], [place] [address] Phone:  [phone]   01/30/23 Jill Fisher, [place]   Dear [name],  I am writing to inform you that the biopsies taken during your recent endoscopic examination showed:  FINAL MICROSCOPIC DIAGNOSIS:   A. STOMACH, RANDOM, BIOPSY:  - Chronic active gastritis with intestinal metaplasia  - Positive for H. pylori on immunohistochemical stain  - Negative for dysplasia or malignancy   B. COLON, ASCENDING, POLYPECTOMY:  - Tubular adenoma.  - No high grade dysplasia or malignancy.   What does this mean?  There is a bacteria in the stomach called H. pylori which can cause inflammation and also associated with stomach ulcer and stomach cancer  As discussed with you over the phone since we did not see any ulcers we will hold on to treating this bacteria which would require a large amount of antibiotics (3 antibiotics 3 times a day for 14 days) given risk for other condition such as bacterial infection (C. Difficile) , nausea ,vomiting and dehydration.  If abdominal pain continues we can rediscuss this and treating this bacteria in future.  Also colonoscopy had 2 polyps which were tubular adenoma, this is not cancer but had it stayed in your colon this could have become cancer, hence it is a good thing to we took it out and nothing to worry about  Also as discussed over the phone please follow-up with your primary care doctor for  slightly elevated kidney function .  Please call us at [phone] if you have persistent problems or have questions about your condition that have not been fully answered at this time.  Sincerely,  Vista Lawman, MD Gastroenterology and Hepatology

## 2023-02-03 ENCOUNTER — Encounter (INDEPENDENT_AMBULATORY_CARE_PROVIDER_SITE_OTHER): Payer: Self-pay | Admitting: *Deleted

## 2023-02-11 DIAGNOSIS — K594 Anal spasm: Secondary | ICD-10-CM | POA: Diagnosis not present

## 2023-02-11 DIAGNOSIS — K5909 Other constipation: Secondary | ICD-10-CM | POA: Diagnosis not present

## 2023-02-11 DIAGNOSIS — Z6824 Body mass index (BMI) 24.0-24.9, adult: Secondary | ICD-10-CM | POA: Diagnosis not present

## 2023-02-11 DIAGNOSIS — I10 Essential (primary) hypertension: Secondary | ICD-10-CM | POA: Diagnosis not present

## 2023-02-11 DIAGNOSIS — R5383 Other fatigue: Secondary | ICD-10-CM | POA: Diagnosis not present

## 2023-04-22 DIAGNOSIS — Z6825 Body mass index (BMI) 25.0-25.9, adult: Secondary | ICD-10-CM | POA: Diagnosis not present

## 2023-04-22 DIAGNOSIS — I10 Essential (primary) hypertension: Secondary | ICD-10-CM | POA: Diagnosis not present

## 2023-04-22 DIAGNOSIS — I5032 Chronic diastolic (congestive) heart failure: Secondary | ICD-10-CM | POA: Diagnosis not present

## 2023-04-27 ENCOUNTER — Telehealth: Payer: Self-pay | Admitting: Cardiology

## 2023-04-27 NOTE — Telephone Encounter (Signed)
Patient c/o Palpitations:  STAT if patient reporting lightheadedness, shortness of breath, or chest pain  How long have you had palpitations/irregular HR/ Afib? Are you having the symptoms now? November/No  Are you currently experiencing lightheadedness, SOB or CP? No, not at the moment  Do you have a history of afib (atrial fibrillation) or irregular heart rhythm? No  Have you checked your BP or HR? (document readings if available): NO  Are you experiencing any other symptoms? No Pt states that per PCP she needed to be seen sooner than 3/27 appt due to Afib. Please advise

## 2023-04-27 NOTE — Telephone Encounter (Signed)
Spoke with patient - moved appointment up at pcp request & will also place on wait list.  Patient verbalized understanding.

## 2023-04-30 ENCOUNTER — Telehealth: Payer: Self-pay | Admitting: Cardiology

## 2023-04-30 NOTE — Telephone Encounter (Signed)
Patient states she is returning a call from this morning, but does not know what it was regarding. Please advise.

## 2023-05-26 ENCOUNTER — Ambulatory Visit: Payer: Medicare HMO | Admitting: Internal Medicine

## 2023-05-28 ENCOUNTER — Ambulatory Visit: Payer: Medicare HMO | Attending: Nurse Practitioner | Admitting: Nurse Practitioner

## 2023-05-28 ENCOUNTER — Encounter: Payer: Self-pay | Admitting: Nurse Practitioner

## 2023-05-28 VITALS — BP 128/78 | HR 101 | Ht 66.0 in | Wt 150.0 lb

## 2023-05-28 DIAGNOSIS — R195 Other fecal abnormalities: Secondary | ICD-10-CM | POA: Diagnosis not present

## 2023-05-28 DIAGNOSIS — K921 Melena: Secondary | ICD-10-CM

## 2023-05-28 DIAGNOSIS — I10 Essential (primary) hypertension: Secondary | ICD-10-CM

## 2023-05-28 DIAGNOSIS — E785 Hyperlipidemia, unspecified: Secondary | ICD-10-CM

## 2023-05-28 DIAGNOSIS — I38 Endocarditis, valve unspecified: Secondary | ICD-10-CM

## 2023-05-28 DIAGNOSIS — R002 Palpitations: Secondary | ICD-10-CM

## 2023-05-28 DIAGNOSIS — I4819 Other persistent atrial fibrillation: Secondary | ICD-10-CM

## 2023-05-28 DIAGNOSIS — R194 Change in bowel habit: Secondary | ICD-10-CM

## 2023-05-28 MED ORDER — METOPROLOL SUCCINATE ER 50 MG PO TB24: 50.0000 mg | ORAL_TABLET | Freq: Every evening | ORAL | 6 refills | Status: DC

## 2023-05-28 NOTE — Progress Notes (Addendum)
 Cardiology Office Note:  .   Date:  05/28/2023  ID:  Jill Fisher, DOB 1934-08-06, MRN [id number] PCP: Veda Gerald, MD  [facility] Providers Cardiologist:  Lasalle Pointer, MD    History of Present Illness: .   Jill Fisher is a 89 y.o. female with a PMH of persistent A-fib, PSVT, hypertension, hyperlipidemia, type 2 diabetes, valvular insufficiency, who presents today for scheduled follow-up.  Previously seen by [facility] cardiology for management of her persistent A-fib.  NST in 2020 was negative for ischemia/scar.  Echocardiogram that year showed normal EF, moderate to severe TR, mild to moderate MR.  Last seen by Dr. Mallipeddi on November 20, 2022.  Patient noted mild shortness of breath with exertion.  Noted occasional palpitations, few times per month.  Chest pain was only noted with overexertion, have been chronic and ongoing since 2022.  Noted to be A-fib with RVR, 114 bpm on EKG.  Toprol -XL increased from 100 mg to 150 mg once daily.  Echocardiogram was updated and revealed EF 50%, mild MR, mild to moderate TR.  Today she presents for scheduled follow-up.  She admits to intermittent dark stools that are better in appearance than previously before she had a colonoscopy performed.  Has not followed up with GI since her colonoscopy in November 2024, admits to some bowel frequency.  Confirms to me she is only taking Toprol -XL 100 mg daily.  Tells me she can tell that she remains in A-fib most of the time, has been ongoing since last summer.  Admits to shortness of breath only with exertion.  Family member in the room confirms heart rate stays mainly in 70s with occasionally heart rate in 110s 120s.  Patient says diltiazem  seems to help when she takes this as needed for palpitations.  Family member admits to patient feeling nervous/jittery when in A-fib, will have to lie down.  Denies any chest pain, syncope, presyncope, dizziness, orthopnea, PND, swelling or significant weight  changes, or claudication.  ROS: Negative.  See HPI.  Studies Reviewed: Aaron Aas    EKG:  EKG Interpretation Date/Time:  Thursday May 28 2023 08:37:02 EDT Ventricular Rate:  101 PR Interval:    QRS Duration:  68 QT Interval:  294 QTC Calculation: 381 R Axis:   84  Text Interpretation: Atrial fibrillation with rapid ventricular response ST & T wave abnormality, consider inferior ischemia When compared with ECG of 10-Dec-2022 09:29, Nonspecific T wave abnormality, worse in Anterior leads QT has shortened Confirmed by Lasalle Pointer [id number]) on 05/28/2023 8:47:27 AM   Echo 12/2022:  1. Left ventricular ejection fraction, by estimation, is 50%. The left  ventricle has low normal function. Left ventricular endocardial border not  optimally defined to evaluate regional wall motion. Left ventricular  diastolic function could not be  evaluated.   2. Right ventricular systolic function is normal. The right ventricular  size is normal. There is normal pulmonary artery systolic pressure.   3. The mitral valve is normal in structure. Mild mitral valve  regurgitation. No evidence of mitral stenosis.   4. The tricuspid valve is abnormal. Tricuspid valve regurgitation is mild  to moderate.   5. The aortic valve is tricuspid. There is mild calcification of the  aortic valve. Aortic valve regurgitation is not visualized. No aortic  stenosis is present.   6. The inferior vena cava is normal in size with greater than 50%  respiratory variability, suggesting right atrial pressure of 3 mmHg.   Comparison(s): No prior  Echocardiogram.  Physical Exam:   VS:  BP 128/78 (BP Location: Right Arm, Patient Position: Supine, Cuff Size: Normal)   Pulse (!) 101   Ht 5\' 6"  (1.676 m)   Wt 150 lb (68 kg)   SpO2 97%   BMI 24.21 kg/m    Wt Readings from Last 3 Encounters:  05/28/23 150 lb (68 kg)  01/21/23 149 lb 4.8 oz (67.7 kg)  01/19/23 149 lb 4.8 oz (67.7 kg)    GEN: Thin, 89 year old female in no acute  distress NECK: No JVD; No carotid bruits CARDIAC: S1/S2, irregularly irregular rhythm, no murmurs, rubs, gallops RESPIRATORY:  Clear and diminished to auscultation without rales, wheezing or rhonchi  ABDOMEN: Soft, non-tender, non-distended EXTREMITIES:  No edema; No deformity   ASSESSMENT AND PLAN: .    Persistent A-fib, palpitations Admits to palpitations and symptomatic with this, has had to take diltiazem  as needed for palpitations.  Heart rate minimally elevated today.  She confirms to me she is only taking Toprol  100 mg daily.  Will increase Toprol  XL to 100 mg in a.m., 50 mg in p.m. She also admits to some dark stools, however improved since her colonoscopy last fall, etiology unclear, says this happens intermittently. Will provide referral to GI. Will route note to attending cardiologist to see if Eliquis needs to be reduced to 2.5 mg BID since patient is over 13 years old and BMI is close to 60 kg.  Heart healthy diet encouraged.  Recommended Kardia mobile app.  Care and ED precautions discussed.  Will obtain CBC, CMET, and iron panel.  Addendum 06/27/2023: Dr. Mallipeddi stated, "Continue Eliquis 5 mg BID."  HTN BP stable. Discussed to monitor BP at home at least 2 hours after medications and sitting for 5-10 minutes.  Adjusting dosage of Toprol -XL-see above.  Continue rest of medication regimen.  Heart healthy diet recommended.  HLD LDL 45 in 07/2022.  Continue atorvastatin.  Being managed by PCP.  Continue follow-up with PCP.  Heart healthy diet recommended.   Valvular insufficiency  Mild mitral valve regurgitation noted on echocardiogram in October 2024.  Mild to moderate TR also noted.  Plan to update echocardiogram in the next 2-3 years or sooner if clinically indicated.  5.  Dark stools, Increased bowel frequency Notices increased bowel movements after eating, also notes some dark stools since colonoscopy last November, however she says this is improved per her report.   Etiology unclear.  Obtaining labs and will provide referral to GI for further evaluation.  Care and ED precautions discussed.   Dispo: Follow-up with me/APP in 6 to 8 weeks or sooner if anything changes.  Signed, Lasalle Pointer, NP

## 2023-05-28 NOTE — Patient Instructions (Addendum)
 Medication Instructions:   Increase Toprol XL to 100mg  every morning & 50mg  every evening  Continue all other current medications.      Labwork:  CBC, CMET, Iron Panel - orders given today Please do in 1 week  Office will contact with results via phone, letter or mychart.     Testing/Procedures:  none  Follow-Up:  6 - 8 weeks   Any Other Special Instructions Will Be Listed Below (If Applicable).  You have been referred to:  GI   If you need a refill on your cardiac medications before your next appointment, please call your pharmacy.

## 2023-06-01 DIAGNOSIS — E7849 Other hyperlipidemia: Secondary | ICD-10-CM | POA: Diagnosis not present

## 2023-06-01 DIAGNOSIS — E1122 Type 2 diabetes mellitus with diabetic chronic kidney disease: Secondary | ICD-10-CM | POA: Diagnosis not present

## 2023-06-01 DIAGNOSIS — M818 Other osteoporosis without current pathological fracture: Secondary | ICD-10-CM | POA: Diagnosis not present

## 2023-06-01 DIAGNOSIS — N182 Chronic kidney disease, stage 2 (mild): Secondary | ICD-10-CM | POA: Diagnosis not present

## 2023-06-01 DIAGNOSIS — I10 Essential (primary) hypertension: Secondary | ICD-10-CM | POA: Diagnosis not present

## 2023-06-01 DIAGNOSIS — I5032 Chronic diastolic (congestive) heart failure: Secondary | ICD-10-CM | POA: Diagnosis not present

## 2023-06-01 DIAGNOSIS — Z6824 Body mass index (BMI) 24.0-24.9, adult: Secondary | ICD-10-CM | POA: Diagnosis not present

## 2023-06-01 DIAGNOSIS — I480 Paroxysmal atrial fibrillation: Secondary | ICD-10-CM | POA: Diagnosis not present

## 2023-06-01 DIAGNOSIS — Z0000 Encounter for general adult medical examination without abnormal findings: Secondary | ICD-10-CM | POA: Diagnosis not present

## 2023-06-04 ENCOUNTER — Ambulatory Visit: Payer: Medicare HMO | Admitting: Cardiology

## 2023-06-04 ENCOUNTER — Other Ambulatory Visit: Payer: Self-pay | Admitting: Nurse Practitioner

## 2023-06-04 ENCOUNTER — Other Ambulatory Visit (HOSPITAL_COMMUNITY)
Admission: RE | Admit: 2023-06-04 | Discharge: 2023-06-04 | Disposition: A | Discharge status: Home / Self Care | Attending: Nurse Practitioner | Admitting: Nurse Practitioner

## 2023-06-04 DIAGNOSIS — K921 Melena: Secondary | ICD-10-CM | POA: Insufficient documentation

## 2023-06-04 DIAGNOSIS — R195 Other fecal abnormalities: Secondary | ICD-10-CM | POA: Insufficient documentation

## 2023-06-04 LAB — COMPREHENSIVE METABOLIC PANEL WITH GFR
ALT: 15 U/L (ref 0–44)
AST: 22 U/L (ref 15–41)
Albumin: 3.3 g/dL — ABNORMAL LOW (ref 3.5–5.0)
Alkaline Phosphatase: 56 U/L (ref 38–126)
Anion gap: 12 (ref 5–15)
BUN: 14 mg/dL (ref 8–23)
CO2: 24 mmol/L (ref 22–32)
Calcium: 8.8 mg/dL — ABNORMAL LOW (ref 8.9–10.3)
Chloride: 105 mmol/L (ref 98–111)
Creatinine, Ser: 1.01 mg/dL — ABNORMAL HIGH (ref 0.44–1.00)
GFR, Estimated: 54 mL/min — ABNORMAL LOW (ref >60)
Glucose, Bld: 143 mg/dL — ABNORMAL HIGH (ref 70–99)
Potassium: 3.6 mmol/L (ref 3.5–5.1)
Sodium: 141 mmol/L (ref 135–145)
Total Bilirubin: 1.3 mg/dL — ABNORMAL HIGH (ref 0.0–1.2)
Total Protein: 6 g/dL — ABNORMAL LOW (ref 6.5–8.1)

## 2023-06-04 LAB — CBC
HCT: 38.6 % (ref 36.0–46.0)
Hemoglobin: 12.3 g/dL (ref 12.0–15.0)
MCH: 28.3 pg (ref 26.0–34.0)
MCHC: 31.9 g/dL (ref 30.0–36.0)
MCV: 88.7 fL (ref 80.0–100.0)
Platelets: 189 10*3/uL (ref 150–400)
RBC: 4.35 MIL/uL (ref 3.87–5.11)
RDW: 13.9 % (ref 11.5–15.5)
WBC: 6.6 10*3/uL (ref 4.0–10.5)
nRBC: 0 % (ref 0.0–0.2)

## 2023-06-04 NOTE — Addendum Note (Signed)
 Addended by: Sharen Hones on: 06/04/2023 10:17 AM   Modules accepted: Orders

## 2023-06-08 ENCOUNTER — Encounter (INDEPENDENT_AMBULATORY_CARE_PROVIDER_SITE_OTHER): Payer: Self-pay | Admitting: *Deleted

## 2023-06-18 DIAGNOSIS — E113393 Type 2 diabetes mellitus with moderate nonproliferative diabetic retinopathy without macular edema, bilateral: Secondary | ICD-10-CM | POA: Diagnosis not present

## 2023-06-18 DIAGNOSIS — I10 Essential (primary) hypertension: Secondary | ICD-10-CM | POA: Diagnosis not present

## 2023-06-18 DIAGNOSIS — Z6824 Body mass index (BMI) 24.0-24.9, adult: Secondary | ICD-10-CM | POA: Diagnosis not present

## 2023-06-29 ENCOUNTER — Telehealth: Payer: Self-pay | Admitting: Nurse Practitioner

## 2023-06-29 NOTE — Telephone Encounter (Signed)
 Left patient detailed voicemail regarding this and if any questions to give us  a call back

## 2023-06-29 NOTE — Telephone Encounter (Signed)
-----   Message from Lasalle Pointer sent at 06/27/2023  8:09 PM EDT ----- Please let pt know I heard back from Dr. Mallipeddi. Stated to continue Eliquis at 5 mg twice daily.   Thanks!   Best, Lasalle Pointer, NP ----- Message ----- From: Lasalle Pointer, MD Sent: 06/26/2023   7:59 PM EDT To: Lasalle Pointer, NP  Hb stable. Continue Eliquis 5 mg BID. No need to reduce the dose. EGD and colonoscopy in 2024 are normal except for gastritis and polyps respectively. I believe she already has GI f/u. ----- Message ----- From: Lasalle Pointer, NP Sent: 05/28/2023   2:35 PM EDT To: Vishnu P Mallipeddi, MD  Had colonoscopy in November 2024. Notes intermittent dark stools, overall improved from last fall. Referring her to GI and getting labs. Wanted to consult you and see if okay to reduce dose of Eliquis?  Thanks!   Best, Lasalle Pointer, NP

## 2023-07-01 ENCOUNTER — Encounter (INDEPENDENT_AMBULATORY_CARE_PROVIDER_SITE_OTHER): Admitting: Gastroenterology

## 2023-07-03 ENCOUNTER — Encounter (INDEPENDENT_AMBULATORY_CARE_PROVIDER_SITE_OTHER): Payer: Self-pay | Admitting: Gastroenterology

## 2023-07-03 ENCOUNTER — Ambulatory Visit (INDEPENDENT_AMBULATORY_CARE_PROVIDER_SITE_OTHER): Admitting: Gastroenterology

## 2023-07-03 VITALS — BP 130/86 | HR 82 | Temp 97.8°F | Ht 66.0 in | Wt 144.4 lb

## 2023-07-03 DIAGNOSIS — K2970 Gastritis, unspecified, without bleeding: Secondary | ICD-10-CM | POA: Diagnosis not present

## 2023-07-03 DIAGNOSIS — Z800 Family history of malignant neoplasm of digestive organs: Secondary | ICD-10-CM

## 2023-07-03 DIAGNOSIS — K921 Melena: Secondary | ICD-10-CM | POA: Diagnosis not present

## 2023-07-03 DIAGNOSIS — B9681 Helicobacter pylori [H. pylori] as the cause of diseases classified elsewhere: Secondary | ICD-10-CM | POA: Diagnosis not present

## 2023-07-03 DIAGNOSIS — Z860100 Personal history of colon polyps, unspecified: Secondary | ICD-10-CM

## 2023-07-03 MED ORDER — PANTOPRAZOLE SODIUM 40 MG PO TBEC: 40.0000 mg | DELAYED_RELEASE_TABLET | Freq: Two times a day (BID) | ORAL | 3 refills | Status: DC

## 2023-07-03 MED ORDER — PSYLLIUM 58.6 % PO PACK
1.0000 | PACK | Freq: Two times a day (BID) | ORAL | 2 refills | Status: AC
Start: 2023-07-03 — End: 2023-10-01

## 2023-07-03 MED ORDER — BISMUTH/METRONIDAZ/TETRACYCLIN 140-125-125 MG PO CAPS: 3.0000 | ORAL_CAPSULE | Freq: Four times a day (QID) | ORAL | 0 refills | Status: DC

## 2023-07-03 NOTE — Patient Instructions (Addendum)
 It was very nice to meet you today, as dicussed with will plan for the following :  For Hpylori treatment : 3 capsules (bismuth subcitrate potassium 140 mg/metronidazole 125 mg/tetracycline hydrochloride 125 mg per capsule) orally 4 times daily for 10 days, in combination with Pantoprazole 40 mg orally twice daily   Ensure adequate fluid intake: Aim for 8 glasses of water  daily. Follow a high fiber diet: Include foods such as dates, prunes, pears, and kiwi. Use Metamucil twice a day.

## 2023-07-03 NOTE — Progress Notes (Signed)
 Asuka Dusseau Faizan Lexie Morini , M.D. Gastroenterology & Hepatology [facility] [address] [place], [place] [address] Primary Care Physician: Veda Gerald, MD [address] [place] [address]  Chief Complaint:  Hematochezia/Melena   History of Present Illness:  Jill Fisher is a 89 y.o. female with persistent A-fib on eliquis , valvular heart disease (MR and TR, moderate), HTN, DM 2, HLD, with family history of colon cancer ( 3 sisters ago 21's)  who presents for follow-up and treatment of H. pylori gastritis and intermittent hematochezia  Patient was last seen November 2024 for melena and hematochezia.  Underwent upper endoscopy and colonoscopy found to have H. pylori gastritis and diverticulosis and the most likely cause of hematochezia was diverticular bleed  Patient reports she is feeling well has occasional abdominal discomfort.  Only sees blood upon wiping very seldomly.The patient denies having any nausea, vomiting, fever, chills, hematochezia, melena, hematemesis, abdominal distention, abdominal pain, diarrhea, jaundice, pruritus or weight loss.  Last EGD:01/2023  - Gastritis. Biopsied. - Normal duodenal bulb and second portion of the duodenum.  A. STOMACH, RANDOM, BIOPSY:  - Chronic active gastritis with intestinal metaplasia  - Positive for H. pylori on immunohistochemical stain  - Negative for dysplasia or malignancy   B. COLON, ASCENDING, POLYPECTOMY:  - Tubular adenoma.  - No high grade dysplasia or malignancy.   Last Colonoscopy:01/2023 - Two 3 to 6 mm polyps in the ascending colon, removed with a cold snare. Resected and retrieved. - Diverticulosis in the entire examined colon. - The examined portion of the ileum was normal. - Non- bleeding external and internal hemorrhoids. - Likely cause of self limiting GI bleed in this case was diverticular bleed  2014 Examination performed to cecum. Pancolonic diverticulosis. Five  small polyps ablated via cold biopsy from ascending colon and submitted together.  Patient had 5 small polyps removed from your ascending colon and and 3 are possibly adenomas  FHx: Patient reports 3 of her sisters in 42s had colon cancer Social: neg smoking, alcohol or illicit drug use Surgical: Appendectomy, cholecystectomy  Past Medical History: Past Medical History:  Diagnosis Date   A-fib (HCC)    Allergic arthritis of shoulder region, right    Cerebrovascular disease    followed at [facility]   Coronary artery disease    LVEF  65%   Diabetes mellitus without complication (HCC)    Type II   GERD (gastroesophageal reflux disease)    Hyperlipidemia    Hypertension     Past Surgical History: Past Surgical History:  Procedure Laterality Date   APPENDECTOMY     BIOPSY  01/22/2023   Procedure: BIOPSY;  Surgeon: Hargis Lias, MD;  [facility];  Service: Endoscopy;;   CARDIAC CATHETERIZATION     CAROTID ENDARTERECTOMY     left   CAROTID STENT INSERTION Left    CATARACT EXTRACTION W/PHACO Right 11/30/2012   Procedure: CATARACT EXTRACTION PHACO AND INTRAOCULAR LENS PLACEMENT (IOC);  Surgeon: Lavonia Powers T. Gennie Kicks, MD;  Location: [facility];  Service: Ophthalmology;  Laterality: Right;  CDE:12.71   CATARACT EXTRACTION W/PHACO Left 12/21/2012   Procedure: CATARACT EXTRACTION PHACO AND INTRAOCULAR LENS PLACEMENT (IOC);  Surgeon: Lavonia Powers T. Gennie Kicks, MD;  Location: [facility];  Service: Ophthalmology;  Laterality: Left;  CDE:12.65   CHOLECYSTECTOMY     COLONOSCOPY N/A 06/29/2012   Procedure: COLONOSCOPY;  Surgeon: Ruby Corporal, MD;  [facility];  Service: Endoscopy;  Laterality: N/A;  1200-moved to  1215 [name] to notify pt   COLONOSCOPY WITH PROPOFOL  N/A 01/22/2023   Procedure: COLONOSCOPY WITH PROPOFOL ;  Surgeon: Hargis Lias, MD;  [facility];  Service: Endoscopy;  Laterality: N/A;  9:30AM;ASA 3   ESOPHAGOGASTRODUODENOSCOPY (EGD) WITH PROPOFOL  N/A 01/22/2023    Procedure: ESOPHAGOGASTRODUODENOSCOPY (EGD) WITH PROPOFOL ;  Surgeon: Hargis Lias, MD;  [facility];  Service: Endoscopy;  Laterality: N/A;  9:30AM;ASA 3   POLYPECTOMY  01/22/2023   Procedure: POLYPECTOMY;  Surgeon: Hargis Lias, MD;  [facility];  Service: Endoscopy;;   REVERSE SHOULDER ARTHROPLASTY Right 09/03/2017   Procedure: REVERSE RIGHT SHOULDER ARTHROPLASTY;  Surgeon: Ellard Gunning, MD;  Location: [facility];  Service: Orthopedics;  Laterality: Right;   YAG LASER APPLICATION Right 07/03/2015   Procedure: YAG LASER APPLICATION;  Surgeon: Albert Huff, MD;  Location: [facility];  Service: Ophthalmology;  Laterality: Right;   YAG LASER APPLICATION Left 07/17/2015   Procedure: YAG LASER APPLICATION;  Surgeon: Albert Huff, MD;  Location: [facility];  Service: Ophthalmology;  Laterality: Left;    Family History: Family History  Problem Relation Age of Onset   Heart disease Mother    Cerebrovascular Disease Father    Cancer Sister    Cerebrovascular Disease Brother    Diabetes Brother    Heart attack Brother    Liver disease Brother     Social History: Social History   Tobacco Use  Smoking Status Never  Smokeless Tobacco Never   Social History   Substance and Sexual Activity  Alcohol Use No   Social History   Substance and Sexual Activity  Drug Use No    Allergies: Allergies  Allergen Reactions   Codeine Anxiety and Other (See Comments)    Jitters     Medications: Current Outpatient Medications  Medication Sig Dispense Refill   alendronate (FOSAMAX) 70 MG tablet Take 70 mg by mouth once a week.     apixaban (ELIQUIS) 5 MG TABS tablet Take 1 tablet by mouth 2 (two) times daily.     atorvastatin (LIPITOR) 20 MG tablet Take 20 mg by mouth at bedtime.     diltiazem  (CARDIZEM ) 60 MG tablet Take 1 tablet (60 mg total) by mouth every 8 (eight) hours. 270 tablet 3   furosemide  (LASIX ) 20 MG tablet Take 20 mg by mouth daily.     glimepiride (AMARYL) 1  MG tablet Take 1 mg by mouth daily.     losartan (COZAAR) 25 MG tablet Take 25 mg by mouth daily.     metFORMIN (GLUCOPHAGE) 1000 MG tablet Take 1,000 mg by mouth 2 (two) times daily with a meal.      nitroGLYCERIN  (NITROSTAT ) 0.4 MG SL tablet Place 1 tablet (0.4 mg total) under the tongue every 5 (five) minutes x 3 doses as needed for chest pain (if no relief after 3rd does proceed to ED or call 911). 25 tablet 2   aspirin  EC 81 MG tablet Take 81 mg by mouth daily. Swallow whole. (Patient not taking: Reported on 07/03/2023)     metoprolol  succinate (TOPROL  XL) 50 MG 24 hr tablet Take 1 tablet (50 mg total) by mouth every evening. Take with or immediately following a meal. 30 tablet 6   metoprolol  succinate (TOPROL -XL) 100 MG 24 hr tablet Take 100 mg by mouth every morning.     spironolactone (ALDACTONE) 25 MG tablet Take 25 mg by mouth daily.     No current facility-administered medications for this visit.  Review of Systems: GENERAL: negative for malaise, night sweats HEENT: No changes in hearing or vision, no nose bleeds or other nasal problems. NECK: Negative for lumps, goiter, pain and significant neck swelling RESPIRATORY: Negative for cough, wheezing CARDIOVASCULAR: Negative for chest pain, leg swelling, palpitations, orthopnea GI: SEE HPI MUSCULOSKELETAL: Negative for joint pain or swelling, back pain, and muscle pain. SKIN: Negative for lesions, rash HEMATOLOGY Negative for prolonged bleeding, bruising easily, and swollen nodes. ENDOCRINE: Negative for cold or heat intolerance, polyuria, polydipsia and goiter. NEURO: negative for tremor, gait imbalance, syncope and seizures. The remainder of the review of systems is noncontributory.   Physical Exam: BP 130/86 (BP Location: Left Arm, Patient Position: Sitting, Cuff Size: Normal)   Pulse 82   Temp 97.8 F (36.6 C) (Temporal)   Ht 5\' 6"  (1.676 m)   Wt 144 lb 6.4 oz (65.5 kg)   BMI 23.31 kg/m  GENERAL: The patient is AO x3,  in no acute distress. HEENT: Head is normocephalic and atraumatic. EOMI are intact. Mouth is well hydrated and without lesions. NECK: Supple. No masses LUNGS: Clear to auscultation. No presence of rhonchi/wheezing/rales. Adequate chest expansion HEART: RRR, normal s1 and s2. ABDOMEN: Soft, nontender, no guarding, no peritoneal signs, and nondistended. BS +. No masses.    Imaging/Labs: as above     Latest Ref Rng & Units 06/04/2023    9:51 AM 12/06/2020    1:10 PM 08/27/2017   11:16 AM  CBC  WBC 4.0 - 10.5 K/uL 6.6  8.1  9.6   Hemoglobin 12.0 - 15.0 g/dL 40.9  81.1  91.4   Hematocrit 36.0 - 46.0 % 38.6  42.2  41.4   Platelets 150 - 400 K/uL 189  182  255    No results found for: "IRON", "TIBC", "FERRITIN"  I personally reviewed and interpreted the available labs, imaging and endoscopic files.  Last labs from 01/16/2023 baseline hemoglobin 13--->12.5 MCV 90 platelet 190 BUN/creatinine ratio 18 normal liver enzymes  Impression and Plan:   SHERLON NIED is a 89 y.o. female with persistent A-fib on eliquis , valvular heart disease (MR and TR, moderate), HTN, DM 2, HLD, with family history of colon cancer ( 3 sisters ago 41's)  who presents for follow-up and treatment of H. pylori gastritis and intermittent hematochezia  #H.Pylori gastritis  #Intermittent painless hematochezia/constipation   Patient was last seen November 2024 for melena and hematochezia.  Underwent upper endoscopy and colonoscopy found to have H. pylori gastritis and diverticulosis and the most likely cause of hematochezia was diverticular bleed/hemorrhoidal bleed  Patient has family history of colon cancer with 3 sisters in her 79s as per patient had colon cancer  After risk-benefit, limitation discussion patient is agreeable to proceed with diagnostic upper endoscopy and colonoscopy  I again discussed today the pathophysiology of H. pylori which can cause peptic ulcer disease and in certain cases gastric  malignancy as it is a type I carcinogen.  We discussed that treating H. pylori require a large amount of antibiotics (3 antibiotics 3 times a day for 10 days) given risk for other condition such as bacterial infection (C. Difficile) , nausea ,vomiting and dehydration.   After risk-benefit counseling discussion patient would like to give bismuth  based quadruple therapy a try as she has family history of malignancy  Intermittent painless hematochezia: Likely hemorrhoidal bleed in setting of intermittent constipation  Ensure adequate fluid intake: Aim for 8 glasses of water  daily. Follow a high fiber diet: Include foods such  as dates, prunes, pears, and kiwi. Use Metamucil twice a day.  Also patient given ER precautions, if any further significant bleeding is encountered to go to the ER  All questions were answered.      Marvelous Bouwens Faizan Lillyn Wieczorek, MD Gastroenterology and Hepatology [facility] Gastroenterology   This chart has been completed using [facility] Dictation software, and while attempts have been made to ensure accuracy , certain words and phrases may not be transcribed as intended

## 2023-07-07 ENCOUNTER — Telehealth (INDEPENDENT_AMBULATORY_CARE_PROVIDER_SITE_OTHER): Payer: Self-pay

## 2023-07-07 NOTE — Telephone Encounter (Signed)
 Dear Jill Fisher, We received a request from your doctor for a prior authorization on BISMUTH-METRO-TETR [id number]. Based on the information received, the request was APPROVED and your drug will be covered under your Part D benefit. Preferred Alternatives The drug requested for coverage is non-formulary (not on [employer]'s list of preferred drugs) and therefore may be more expensive than other covered alternatives. You can ask your doctor if a covered alternative is right for you. You can review all covered alternatives on [employer]'s website: BareIQ.[email or web address] pharmacy/prescription-coverages. This information is not intended to replace your doctor's treatment plan. For information on which pharmacies are in your network, you can visit QuizMinds.gl or you can call the number on the back of your ID card. For questions regarding your plan coverage, call the number on the back of your ID card. [id number]    [id number] 07/07/2023 Jill Fisher Member ID: [id number] [address] [place], [place]  [id number] Approval of Prescription Drug Coverage Physician's Name: Terril Fetters Physician's Fax Number: [phone] Dear Jill Fisher: In response to you or your physician's or other prescriber's recent request, we have approved coverage of BISMUTH-METRO-TETR 140-125-125. This authorization is good until 03/09/2024. You can get this medication by presenting a prescription from your doctor or other prescriber at any participating pharmacy (if you do not already have a prescription on file). You may have to pay a deductible, copayment or coinsurance for these services. Please refer to your Evidence of Coverage to determine the benefits and limitations that may apply. Additional authorization may be required should your plan benefits or formulary change in a new plan year, or to evaluate safety related or dosing concerns. Call if you need  us  If you have questions, please call our Customer Care team at [phone]. ([phone]). Our hours are 8:00 a.m. to 8:00 p.m. local time, Monday through Friday. Sincerely, [facility] Clinical Pharmacy Review Team

## 2023-07-23 ENCOUNTER — Ambulatory Visit: Attending: Nurse Practitioner | Admitting: Nurse Practitioner

## 2023-07-23 ENCOUNTER — Encounter: Payer: Self-pay | Admitting: Nurse Practitioner

## 2023-07-23 VITALS — BP 120/64 | HR 72 | Ht 67.0 in | Wt 147.8 lb

## 2023-07-23 DIAGNOSIS — I10 Essential (primary) hypertension: Secondary | ICD-10-CM | POA: Diagnosis not present

## 2023-07-23 DIAGNOSIS — E785 Hyperlipidemia, unspecified: Secondary | ICD-10-CM | POA: Diagnosis not present

## 2023-07-23 DIAGNOSIS — R002 Palpitations: Secondary | ICD-10-CM | POA: Diagnosis not present

## 2023-07-23 DIAGNOSIS — I4819 Other persistent atrial fibrillation: Secondary | ICD-10-CM | POA: Diagnosis not present

## 2023-07-23 DIAGNOSIS — I38 Endocarditis, valve unspecified: Secondary | ICD-10-CM | POA: Diagnosis not present

## 2023-07-23 DIAGNOSIS — Z800 Family history of malignant neoplasm of digestive organs: Secondary | ICD-10-CM

## 2023-07-23 DIAGNOSIS — Z860100 Personal history of colon polyps, unspecified: Secondary | ICD-10-CM

## 2023-07-23 DIAGNOSIS — R195 Other fecal abnormalities: Secondary | ICD-10-CM | POA: Diagnosis not present

## 2023-07-23 DIAGNOSIS — R194 Change in bowel habit: Secondary | ICD-10-CM

## 2023-07-23 NOTE — Progress Notes (Unsigned)
 Cardiology Office Note:  .   Date: 07/23/2023 ID:  [name], DOB Jun 28, 1934, MRN [id number] PCP: Veda Gerald, MD  [facility] Providers Cardiologist:  Lasalle Pointer, MD    History of Present Illness: .   Jill Fisher is a 89 y.o. female with a PMH of persistent A-fib, PSVT, hypertension, hyperlipidemia, type 2 diabetes, valvular insufficiency, who presents today for scheduled follow-up.  Previously seen by [facility] cardiology for management of her persistent A-fib.  NST in 2020 was negative for ischemia/scar.  Echocardiogram that year showed normal EF, moderate to severe TR, mild to moderate MR.  Last seen by Dr. Mallipeddi on November 20, 2022.  Patient noted mild shortness of breath with exertion.  Noted occasional palpitations, few times per month.  Chest pain was only noted with overexertion, have been chronic and ongoing since 2022.  Noted to be A-fib with RVR, 114 bpm on EKG.  Toprol -XL increased from 100 mg to 150 mg once daily.  Echocardiogram was updated and revealed EF 50%, mild MR, mild to moderate TR.  05/28/2023 - Today she presents for scheduled follow-up.  She admits to intermittent dark stools that are better in appearance than previously before she had a colonoscopy performed.  Has not followed up with GI since her colonoscopy in November 2024, admits to some bowel frequency.  Confirms to me she is only taking Toprol -XL 100 mg daily.  Tells me she can tell that she remains in A-fib most of the time, has been ongoing since last summer.  Admits to shortness of breath only with exertion.  Family member in the room confirms heart rate stays mainly in 70s with occasionally heart rate in 110s 120s.  Patient says diltiazem  seems to help when she takes this as needed for palpitations.  Family member admits to patient feeling nervous/jittery when in A-fib, will have to lie down.  Denies any chest pain, syncope, presyncope, dizziness, orthopnea, PND, swelling or significant  weight changes, or claudication.  07/23/2023 - She presents today for follow-up. Did have one episode of A-fib since last office visit, but otherwise is doing fine. Says spell didn't last long, and was able to be resolved with her medications. Still noticing dark stools, and bowel upset after eating. Admits to anal pain and what sounds like possible hemorrhoids. Denies any chest pain, shortness of breath, syncope, presyncope, dizziness, orthopnea, PND, swelling or significant weight changes, or claudication.  ROS: Negative.  See HPI.  Studies Reviewed: Aaron Aas    EKG: EKG is not ordered today.      Echo 12/2022:  1. Left ventricular ejection fraction, by estimation, is 50%. The left  ventricle has low normal function. Left ventricular endocardial border not  optimally defined to evaluate regional wall motion. Left ventricular  diastolic function could not be  evaluated.   2. Right ventricular systolic function is normal. The right ventricular  size is normal. There is normal pulmonary artery systolic pressure.   3. The mitral valve is normal in structure. Mild mitral valve  regurgitation. No evidence of mitral stenosis.   4. The tricuspid valve is abnormal. Tricuspid valve regurgitation is mild  to moderate.   5. The aortic valve is tricuspid. There is mild calcification of the  aortic valve. Aortic valve regurgitation is not visualized. No aortic  stenosis is present.   6. The inferior vena cava is normal in size with greater than 50%  respiratory variability, suggesting right atrial pressure of 3 mmHg.  Comparison(s): No prior Echocardiogram.  Physical Exam:   VS:  BP 120/64   Pulse 72   Ht 5\' 7"  (1.702 m)   Wt 147 lb 12.8 oz (67 kg)   SpO2 98%   BMI 23.15 kg/m    Wt Readings from Last 3 Encounters:  07/23/23 147 lb 12.8 oz (67 kg)  07/03/23 144 lb 6.4 oz (65.5 kg)  05/28/23 150 lb (68 kg)    GEN: Thin, 89 year old female in no acute distress NECK: No JVD; No carotid  bruits CARDIAC: S1/S2, irregularly irregular rhythm, no murmurs, rubs, gallops RESPIRATORY:  Clear and diminished to auscultation without rales, wheezing or rhonchi  ABDOMEN: Soft, non-tender, non-distended EXTREMITIES:  No edema; No deformity   ASSESSMENT AND PLAN: .    Persistent A-fib, palpitations, dark stools Admits to one limited episode that was brief in duration. HR is well controlled. Continue Toprol  XL. Continues to admit to dark stools. Will provide referral to GI, pt is requesting a female provider. Continue Eliquis 5 mg BID for stroke prevention - Dr. Mallipeddi previously consulted and agreed with this dosage via Epic. Heart healthy diet encouraged.  Recommended Kardia mobile app.  Care and ED precautions discussed.    HTN BP stable. Discussed to monitor BP at home at least 2 hours after medications and sitting for 5-10 minutes. Continue current medication regimen.  Heart healthy diet recommended.  HLD LDL 45 in 07/2022.  Continue atorvastatin.  Being managed by PCP.  Continue follow-up with PCP.  Heart healthy diet recommended.  Valvular insufficiency  Mild mitral valve regurgitation noted on echocardiogram in October 2024.  Mild to moderate TR also noted.  Plan to update echocardiogram in the next 1-2 years or sooner if clinically indicated.  5.  Dark stools, Increased bowel frequency Notices increased bowel movements after eating, also notes some dark stools  Etiology unclear.  Referring to GI as noted above. Care and ED precautions discussed.   Dispo: Care and ED precautions discussed. Follow-up with MD/[facility] in 3-4 months or sooner if anything changes.  Signed, Lasalle Pointer, NP

## 2023-07-23 NOTE — Patient Instructions (Addendum)
 Medication Instructions:  Your physician recommends that you continue on your current medications as directed. Please refer to the Current Medication list given to you today.  Labwork: None   Testing/Procedures: None   Follow-Up: Your physician recommends that you schedule a follow-up appointment in: 3-4 months   Any Other Special Instructions Will Be Listed Below (If Applicable).  If you need a refill on your cardiac medications before your next appointment, please call your pharmacy.

## 2023-07-28 ENCOUNTER — Encounter (INDEPENDENT_AMBULATORY_CARE_PROVIDER_SITE_OTHER): Payer: Self-pay | Admitting: *Deleted

## 2023-07-30 ENCOUNTER — Telehealth: Payer: Self-pay | Admitting: *Deleted

## 2023-07-30 NOTE — Telephone Encounter (Signed)
 Patient left VM requesting a call back. Reports she had significant rectal bleeding. Wants to know what to do

## 2023-07-30 NOTE — Telephone Encounter (Signed)
 Hello , please advise that if she is having significant blood per rectum to come to the ED for urgent evaluation

## 2023-07-30 NOTE — Telephone Encounter (Signed)
 Pt contacted. Pt states she went to the bathroom last night and had BM when she got up it was full of blood. Pt states that when she got up, she had big spots on floor where it had dripped.  Blood was dark in color; almost black-real dark blood. No bm this morning not bleeding this morning. Some blood when wiped after urinating this morning. BM last night was liquidly. Pt reports no other symptoms at this time. Please advise. Thank you!

## 2023-07-30 NOTE — Telephone Encounter (Signed)
 Pt contacted. Pt made aware to go to ER if significant bleeding per rectum. Pt verbalized understanding  Pt did have appt scheduled for tomorrow but states she has another appt in [place] next week. Pt wants to cancel 07/31/23 appt; she will call back to reschedule.

## 2023-07-30 NOTE — Telephone Encounter (Signed)
 Left message to return call

## 2023-07-31 ENCOUNTER — Ambulatory Visit (INDEPENDENT_AMBULATORY_CARE_PROVIDER_SITE_OTHER): Admitting: Gastroenterology

## 2023-08-12 ENCOUNTER — Encounter (INDEPENDENT_AMBULATORY_CARE_PROVIDER_SITE_OTHER): Admitting: Gastroenterology

## 2023-08-24 DIAGNOSIS — L0390 Cellulitis, unspecified: Secondary | ICD-10-CM | POA: Diagnosis not present

## 2023-08-24 DIAGNOSIS — Z6824 Body mass index (BMI) 24.0-24.9, adult: Secondary | ICD-10-CM | POA: Diagnosis not present

## 2023-08-25 NOTE — Progress Notes (Unsigned)
 Referring Provider: Veda Gerald, MD Primary Care Physician:  Veda Gerald, MD Primary GI Physician: Dr. Alita Irwin  No chief complaint on file.   HPI:   Jill Fisher is a 89 y.o. female with history of persistent A-fib on Eliquis, valvular heart disease, (MR and TR, moderate), HTN, DM 2, HLD, with family history of colon cancer ( 3 sisters in their 63's), H. pylori gastritis diagnosed via EGD in November 2024, adenomatous colon polyps, diverticulosis with suspected diverticular bleed in November 2024, hemorrhoids, presenting today for ***  Last seen in the office 07/03/2023.  She was prescribed Pylera with pantoprazole  for H. pylori.  She reported some intermittent painless rectal bleeding which was felt to be secondary to hemorrhoids in the setting of constipation.  She is advised to increase water  intake, high-fiber diet, add Metamucil twice a day.   Today:      Last colonoscopy 01/22/2023: - Two 3 to 6 mm polyps in the ascending colon, removed with a cold snare. Resected and retrieved.  - Diverticulosis in the entire examined colon.  - The examined portion of the ileum was normal.  - Non- bleeding external and internal hemorrhoids.  - Likely cause of self limiting GI bleed in this case was diverticular bleed - Pathology with tubular adenoma.  EGD 01/22/2023: - Gastritis. Biopsied.  - Normal duodenal bulb and second portion of the duodenum. -Pathology with chronic active gastritis with intestinal metaplasia positive for H. pylori.    Past Medical History:  Diagnosis Date   A-fib (HCC)    Allergic arthritis of shoulder region, right    Cerebrovascular disease    followed at [facility]   Coronary artery disease    LVEF  65%   Diabetes mellitus without complication (HCC)    Type II   GERD (gastroesophageal reflux disease)    Hyperlipidemia    Hypertension     Past Surgical History:  Procedure Laterality Date   APPENDECTOMY     BIOPSY  01/22/2023   Procedure:  BIOPSY;  Surgeon: Hargis Lias, MD;  [facility];  Service: Endoscopy;;   CARDIAC CATHETERIZATION     CAROTID ENDARTERECTOMY     left   CAROTID STENT INSERTION Left    CATARACT EXTRACTION W/PHACO Right 11/30/2012   Procedure: CATARACT EXTRACTION PHACO AND INTRAOCULAR LENS PLACEMENT (IOC);  Surgeon: Lavonia Powers T. Gennie Kicks, MD;  Location: [facility];  Service: Ophthalmology;  Laterality: Right;  CDE:12.71   CATARACT EXTRACTION W/PHACO Left 12/21/2012   Procedure: CATARACT EXTRACTION PHACO AND INTRAOCULAR LENS PLACEMENT (IOC);  Surgeon: Lavonia Powers T. Gennie Kicks, MD;  Location: [facility];  Service: Ophthalmology;  Laterality: Left;  CDE:12.65   CHOLECYSTECTOMY     COLONOSCOPY N/A 06/29/2012   Procedure: COLONOSCOPY;  Surgeon: Ruby Corporal, MD;  [facility];  Service: Endoscopy;  Laterality: N/A;  1200-moved to 1215 [name] to notify pt   COLONOSCOPY WITH PROPOFOL  N/A 01/22/2023   Procedure: COLONOSCOPY WITH PROPOFOL ;  Surgeon: Hargis Lias, MD;  [facility];  Service: Endoscopy;  Laterality: N/A;  9:30AM;ASA 3   ESOPHAGOGASTRODUODENOSCOPY (EGD) WITH PROPOFOL  N/A 01/22/2023   Procedure: ESOPHAGOGASTRODUODENOSCOPY (EGD) WITH PROPOFOL ;  Surgeon: Hargis Lias, MD;  [facility];  Service: Endoscopy;  Laterality: N/A;  9:30AM;ASA 3   POLYPECTOMY  01/22/2023   Procedure: POLYPECTOMY;  Surgeon: Hargis Lias, MD;  [facility];  Service: Endoscopy;;   REVERSE SHOULDER ARTHROPLASTY Right 09/03/2017   Procedure: REVERSE RIGHT SHOULDER  ARTHROPLASTY;  Surgeon: Ellard Gunning, MD;  Location: [facility];  Service: Orthopedics;  Laterality: Right;   YAG LASER APPLICATION Right 07/03/2015   Procedure: YAG LASER APPLICATION;  Surgeon: Albert Huff, MD;  Location: [facility];  Service: Ophthalmology;  Laterality: Right;   YAG LASER APPLICATION Left 07/17/2015   Procedure: YAG LASER APPLICATION;  Surgeon: Albert Huff, MD;  Location: [facility];  Service: Ophthalmology;  Laterality:  Left;    Current Outpatient Medications  Medication Sig Dispense Refill   alendronate (FOSAMAX) 70 MG tablet Take 70 mg by mouth once a week.     apixaban (ELIQUIS) 5 MG TABS tablet Take 1 tablet by mouth 2 (two) times daily.     atorvastatin (LIPITOR) 20 MG tablet Take 20 mg by mouth at bedtime.     diltiazem  (CARDIZEM ) 60 MG tablet Take 1 tablet (60 mg total) by mouth every 8 (eight) hours. 270 tablet 3   furosemide  (LASIX ) 20 MG tablet Take 20 mg by mouth daily.     glimepiride (AMARYL) 1 MG tablet Take 1 mg by mouth daily.     losartan (COZAAR) 25 MG tablet Take 25 mg by mouth daily.     metFORMIN (GLUCOPHAGE) 1000 MG tablet Take 1,000 mg by mouth 2 (two) times daily with a meal.      metoprolol  succinate (TOPROL  XL) 50 MG 24 hr tablet Take 1 tablet (50 mg total) by mouth every evening. Take with or immediately following a meal. 30 tablet 6   metoprolol  succinate (TOPROL -XL) 100 MG 24 hr tablet Take 100 mg by mouth every morning.     nitroGLYCERIN  (NITROSTAT ) 0.4 MG SL tablet Place 1 tablet (0.4 mg total) under the tongue every 5 (five) minutes x 3 doses as needed for chest pain (if no relief after 3rd does proceed to ED or call 911). 25 tablet 2   pantoprazole  (PROTONIX ) 40 MG tablet Take 1 tablet (40 mg total) by mouth 2 (two) times daily for 10 days. 60 tablet 3   psyllium (METAMUCIL) 58.6 % packet Take 1 packet by mouth 2 (two) times daily. 60 packet 2   spironolactone (ALDACTONE) 25 MG tablet Take 25 mg by mouth daily.     No current facility-administered medications for this visit.    Allergies as of 08/27/2023 - Review Complete 07/23/2023  Allergen Reaction Noted   Codeine Anxiety and Other (See Comments)     Family History  Problem Relation Age of Onset   Heart disease Mother    Cerebrovascular Disease Father    Cancer Sister    Cerebrovascular Disease Brother    Diabetes Brother    Heart attack Brother    Liver disease Brother     Social History   Socioeconomic  History   Marital status: Widowed    Spouse name: Not on file   Number of children: 2   Years of education: HS   Highest education level: Not on file  Occupational History   Not on file  Tobacco Use   Smoking status: Never   Smokeless tobacco: Never  Vaping Use   Vaping status: Never Used  Substance and Sexual Activity   Alcohol use: No   Drug use: No   Sexual activity: Not on file  Other Topics Concern   Not on file  Social History Narrative   Not on file   Social Drivers of Health   Financial Resource Strain: Not on file  Food Insecurity: Food Insecurity Present (11/29/2021)   Hunger Vital  Sign    Worried About Programme researcher, broadcasting/film/video in the Last Year: Sometimes true    Ran Out of Food in the Last Year: Sometimes true  Transportation Needs: No Transportation Needs (11/28/2021)   PRAPARE - Administrator, Civil Service (Medical): No    Lack of Transportation (Non-Medical): No  Physical Activity: Not on file  Stress: Not on file  Social Connections: Not on file    Review of Systems: Gen: Denies fever, chills, anorexia. Denies fatigue, weakness, weight loss.  CV: Denies chest pain, palpitations, syncope, peripheral edema, and claudication. Resp: Denies dyspnea at rest, cough, wheezing, coughing up blood, and pleurisy. GI: Denies vomiting blood, jaundice, and fecal incontinence.   Denies dysphagia or odynophagia. Derm: Denies rash, itching, dry skin Psych: Denies depression, anxiety, memory loss, confusion. No homicidal or suicidal ideation.  Heme: Denies bruising, bleeding, and enlarged lymph nodes.  Physical Exam: There were no vitals taken for this visit. General:   Alert and oriented. No distress noted. Pleasant and cooperative.  Head:  Normocephalic and atraumatic. Eyes:  Conjuctiva clear without scleral icterus. Heart:  S1, S2 present without murmurs appreciated. Lungs:  Clear to auscultation bilaterally. No wheezes, rales, or rhonchi. No distress.   Abdomen:  +BS, soft, non-tender and non-distended. No rebound or guarding. No HSM or masses noted. Msk:  Symmetrical without gross deformities. Normal posture. Extremities:  Without edema. Neurologic:  Alert and  oriented x4 Psych:  Normal mood and affect.    Assessment:     Plan:  ***   Shana Daring, PA-C [facility] 08/27/2023

## 2023-08-27 ENCOUNTER — Ambulatory Visit (INDEPENDENT_AMBULATORY_CARE_PROVIDER_SITE_OTHER): Admitting: Gastroenterology

## 2023-08-27 ENCOUNTER — Encounter: Payer: Self-pay | Admitting: Gastroenterology

## 2023-08-27 VITALS — BP 133/68 | HR 74 | Temp 97.8°F | Ht 66.0 in | Wt 149.0 lb

## 2023-08-27 DIAGNOSIS — R197 Diarrhea, unspecified: Secondary | ICD-10-CM

## 2023-08-27 DIAGNOSIS — K625 Hemorrhage of anus and rectum: Secondary | ICD-10-CM | POA: Diagnosis not present

## 2023-08-27 DIAGNOSIS — K649 Unspecified hemorrhoids: Secondary | ICD-10-CM

## 2023-08-27 DIAGNOSIS — Z8719 Personal history of other diseases of the digestive system: Secondary | ICD-10-CM

## 2023-08-27 DIAGNOSIS — A048 Other specified bacterial intestinal infections: Secondary | ICD-10-CM | POA: Diagnosis not present

## 2023-08-27 DIAGNOSIS — Z860101 Personal history of adenomatous and serrated colon polyps: Secondary | ICD-10-CM

## 2023-08-27 MED ORDER — BISMUTH SUBSALICYLATE 262 MG PO CAPS
524.0000 mg | ORAL_CAPSULE | Freq: Four times a day (QID) | ORAL | 0 refills | Status: AC
Start: 2023-08-27 — End: 2023-09-10

## 2023-08-27 MED ORDER — METRONIDAZOLE 500 MG PO TABS: 500.0000 mg | ORAL_TABLET | Freq: Three times a day (TID) | ORAL | 0 refills | Status: AC

## 2023-08-27 MED ORDER — HYDROCORTISONE (PERIANAL) 2.5 % EX CREA: 1.0000 | TOPICAL_CREAM | Freq: Two times a day (BID) | CUTANEOUS | 1 refills | Status: DC

## 2023-08-27 MED ORDER — PANTOPRAZOLE SODIUM 40 MG PO TBEC: 40.0000 mg | DELAYED_RELEASE_TABLET | Freq: Two times a day (BID) | ORAL | 0 refills | Status: DC

## 2023-08-27 MED ORDER — TETRACYCLINE HCL 500 MG PO CAPS: 500.0000 mg | ORAL_CAPSULE | Freq: Four times a day (QID) | ORAL | 0 refills | Status: AC

## 2023-08-27 NOTE — Patient Instructions (Addendum)
 Please have labs and stool testing completed at [facility].  We will call you with results and recommendations.  I suspect your rectal bleeding is secondary to diverticular bleeding or hemorrhoidal bleeding.   I am sending in Anusol rectal cream for possible hemorrhoidal bleeding. You can use this as needed twice a day for about 5 days at a time.   You have H pylori. This is a bacteria in your stomach. We will treat H. pylori with the following regimen, but do not start taking this regimen until you have completed your current course of antibiotics for skin infection: - Pantoprazole  40 mg twice a day for 14 days - Bismuth  subsalicylate tablets (Pepto-Bismol).  Take 2 tablets for total of 524 mg 4 times a day for 14 days - Tetracycline 500 mg 4 times a day for 14 days - Metronidazole 500 mg 3 times a day for 14 days. *It is very important that you you take all these medications together as prescribed to treat H. pylori.  If not, the treatment will not be effective.  I have sent all prescriptions to your pharmacy, but please be aware that bismuth  may not be provided to you by your pharmacist as this is also over-the-counter and you may have to pick it up over-the-counter.  You can ask your pharmacist about this.  It was nice to meet you today!  Shana Daring, PA-C [facility]

## 2023-08-28 ENCOUNTER — Encounter: Payer: Self-pay | Admitting: Gastroenterology

## 2023-09-01 ENCOUNTER — Telehealth: Payer: Self-pay | Admitting: *Deleted

## 2023-09-01 NOTE — Telephone Encounter (Signed)
 Received PA for tetracycline . Called pharmacy and pt paid cash for medications.

## 2023-09-01 NOTE — Telephone Encounter (Signed)
 Glad she was able to get the medications!

## 2023-09-02 DIAGNOSIS — R197 Diarrhea, unspecified: Secondary | ICD-10-CM | POA: Diagnosis not present

## 2023-09-03 LAB — TISSUE TRANSGLUTAMINASE, IGA: (tTG) Ab, IgA: <1 U/mL

## 2023-09-03 LAB — IGA: Immunoglobulin A: 143 mg/dL (ref 70–320)

## 2023-09-03 LAB — TSH: TSH: 2.99 m[IU]/L (ref 0.40–4.50)

## 2023-09-06 ENCOUNTER — Ambulatory Visit: Payer: Self-pay | Admitting: Gastroenterology

## 2023-09-06 DIAGNOSIS — K8681 Exocrine pancreatic insufficiency: Secondary | ICD-10-CM

## 2023-09-09 DIAGNOSIS — R197 Diarrhea, unspecified: Secondary | ICD-10-CM | POA: Diagnosis not present

## 2023-09-17 LAB — GASTROINTESTINAL PATHOGEN PNL
CampyloBacter Group: NOT DETECTED
Norovirus GI/GII: NOT DETECTED
Rotavirus A: NOT DETECTED
Salmonella species: NOT DETECTED
Shiga Toxin 1: NOT DETECTED
Shiga Toxin 2: NOT DETECTED
Shigella Species: NOT DETECTED
Vibrio Group: NOT DETECTED
Yersinia enterocolitica: NOT DETECTED

## 2023-09-17 LAB — GIARDIA ANTIGEN
MICRO NUMBER:: 16653647
RESULT:: NOT DETECTED
SPECIMEN QUALITY:: ADEQUATE

## 2023-09-17 LAB — C. DIFFICILE GDH AND TOXIN A/B
GDH ANTIGEN: NOT DETECTED
MICRO NUMBER:: 16655351
SPECIMEN QUALITY:: ADEQUATE
TOXIN A AND B: NOT DETECTED

## 2023-09-17 LAB — CRYPTOSPORIDIUM ANTIGEN, EIA
Specimen Quality:: ADEQUATE
micro Number:: 16656843

## 2023-09-17 LAB — PANCREATIC ELASTASE, FECAL: Pancreatic Elastase-1, Stool: 126 ug/g — ABNORMAL LOW (ref >200)

## 2023-09-17 MED ORDER — PANCRELIPASE (LIP-PROT-AMYL) 36000-114000 UNITS PO CPEP: ORAL_CAPSULE | ORAL | 3 refills | Status: DC

## 2023-09-20 ENCOUNTER — Other Ambulatory Visit: Payer: Self-pay | Admitting: Gastroenterology

## 2023-09-20 DIAGNOSIS — K625 Hemorrhage of anus and rectum: Secondary | ICD-10-CM

## 2023-10-13 NOTE — Progress Notes (Deleted)
 Referring Provider: Orpha Yancey LABOR, MD Primary Care Physician:  Orpha Yancey LABOR, MD Primary GI Physician: Dr. PIERRETTE Johns chief complaint on file.   HPI:   Jill Fisher is a 89 y.o. female presenting today with a history of   Past Medical History:  Diagnosis Date   A-fib (HCC)    Allergic arthritis of shoulder region, right    Cerebrovascular disease    followed at [facility]   Coronary artery disease    LVEF  65%   Diabetes mellitus without complication (HCC)    Type II   GERD (gastroesophageal reflux disease)    Hyperlipidemia    Hypertension     Past Surgical History:  Procedure Laterality Date   APPENDECTOMY     BIOPSY  01/22/2023   Procedure: BIOPSY;  Surgeon: Cinderella Deatrice FALCON, MD;  [facility];  Service: Endoscopy;;   CARDIAC CATHETERIZATION     CAROTID ENDARTERECTOMY     left   CAROTID STENT INSERTION Left    CATARACT EXTRACTION W/PHACO Right 11/30/2012   Procedure: CATARACT EXTRACTION PHACO AND INTRAOCULAR LENS PLACEMENT (IOC);  Surgeon: Oneil T. Roz, MD;  Location: [facility];  Service: Ophthalmology;  Laterality: Right;  CDE:12.71   CATARACT EXTRACTION W/PHACO Left 12/21/2012   Procedure: CATARACT EXTRACTION PHACO AND INTRAOCULAR LENS PLACEMENT (IOC);  Surgeon: Oneil T. Roz, MD;  Location: [facility];  Service: Ophthalmology;  Laterality: Left;  CDE:12.65   CHOLECYSTECTOMY     COLONOSCOPY N/A 06/29/2012   Procedure: COLONOSCOPY;  Surgeon: Claudis RAYMOND Rivet, MD;  [facility];  Service: Endoscopy;  Laterality: N/A;  1200-moved to 1215 [name] to notify pt   COLONOSCOPY WITH PROPOFOL  N/A 01/22/2023   Procedure: COLONOSCOPY WITH PROPOFOL ;  Surgeon: Cinderella Deatrice FALCON, MD;  [facility];  Service: Endoscopy;  Laterality: N/A;  9:30AM;ASA 3   ESOPHAGOGASTRODUODENOSCOPY (EGD) WITH PROPOFOL  N/A 01/22/2023   Procedure: ESOPHAGOGASTRODUODENOSCOPY (EGD) WITH PROPOFOL ;  Surgeon: Cinderella Deatrice FALCON, MD;  [facility];  Service: Endoscopy;   Laterality: N/A;  9:30AM;ASA 3   POLYPECTOMY  01/22/2023   Procedure: POLYPECTOMY;  Surgeon: Cinderella Deatrice FALCON, MD;  [facility];  Service: Endoscopy;;   REVERSE SHOULDER ARTHROPLASTY Right 09/03/2017   Procedure: REVERSE RIGHT SHOULDER ARTHROPLASTY;  Surgeon: Melita Drivers, MD;  [facility]: [facility];  Service: Orthopedics;  Laterality: Right;   YAG LASER APPLICATION Right 07/03/2015   Procedure: YAG LASER APPLICATION;  Surgeon: Oneil Roz, MD;  Location: [facility];  Service: Ophthalmology;  Laterality: Right;   YAG LASER APPLICATION Left 07/17/2015   Procedure: YAG LASER APPLICATION;  Surgeon: Oneil Roz, MD;  Location: [facility];  Service: Ophthalmology;  Laterality: Left;    Current Outpatient Medications  Medication Sig Dispense Refill   alendronate (FOSAMAX) 70 MG tablet Take 70 mg by mouth once a week. (Patient not taking: Reported on 08/27/2023)     apixaban (ELIQUIS) 5 MG TABS tablet Take 1 tablet by mouth 2 (two) times daily.     atorvastatin (LIPITOR) 20 MG tablet Take 20 mg by mouth at bedtime. (Patient not taking: Reported on 08/27/2023)     diltiazem  (CARDIZEM ) 60 MG tablet Take 1 tablet (60 mg total) by mouth every 8 (eight) hours. (Patient not taking: Reported on 08/27/2023) 270 tablet 3   furosemide  (LASIX ) 20 MG tablet Take 20 mg by mouth daily.     glimepiride (AMARYL) 1 MG tablet Take 1 mg by mouth daily.     lipase/protease/amylase (  CREON ) 36000 UNITS CPEP capsule Take 2 capsules (72,000 units total) by mouth with meals 3 times a day.  Take 1 capsule (36,000 units total) by mouth with snacks up to twice a day. 240 capsule 3   losartan (COZAAR) 25 MG tablet Take 25 mg by mouth daily.     metFORMIN (GLUCOPHAGE) 1000 MG tablet Take 1,000 mg by mouth 2 (two) times daily with a meal.      metoprolol  succinate (TOPROL  XL) 50 MG 24 hr tablet Take 1 tablet (50 mg total) by mouth every evening. Take with or immediately following a meal. (Patient not taking: Reported on 08/27/2023) 30  tablet 6   metoprolol  succinate (TOPROL -XL) 100 MG 24 hr tablet Take 100 mg by mouth every morning.     nitroGLYCERIN  (NITROSTAT ) 0.4 MG SL tablet Place 1 tablet (0.4 mg total) under the tongue every 5 (five) minutes x 3 doses as needed for chest pain (if no relief after 3rd does proceed to ED or call 911). 25 tablet 2   pantoprazole  (PROTONIX ) 40 MG tablet Take 1 tablet (40 mg total) by mouth 2 (two) times daily before a meal for 14 days. 28 tablet 0   PROCTO-MED HC  2.5 % rectal cream INSERT 1 APPLICATION RECTALLY TWICE DAILY 28 g 1   spironolactone (ALDACTONE) 25 MG tablet Take 25 mg by mouth daily. (Patient not taking: Reported on 08/27/2023)     No current facility-administered medications for this visit.    Allergies as of 10/15/2023 - Review Complete 08/27/2023  Allergen Reaction Noted   Codeine Anxiety and Other (See Comments)     Family History  Problem Relation Age of Onset   Heart disease Mother    Cerebrovascular Disease Father    Cancer Sister    Cerebrovascular Disease Brother    Diabetes Brother    Heart attack Brother    Liver disease Brother     Social History   Socioeconomic History   Marital status: Widowed    Spouse name: Not on file   Number of children: 2   Years of education: HS   Highest education level: Not on file  Occupational History   Not on file  Tobacco Use   Smoking status: Never   Smokeless tobacco: Never  Vaping Use   Vaping status: Never Used  Substance and Sexual Activity   Alcohol use: No   Drug use: No   Sexual activity: Not on file  Other Topics Concern   Not on file  Social History Narrative   Not on file   Social Drivers of Health   Financial Resource Strain: Not on file  Food Insecurity: Food Insecurity Present (11/29/2021)   Hunger Vital Sign    Worried About Running Out of Food in the Last Year: Sometimes true    Ran Out of Food in the Last Year: Sometimes true  Transportation Needs: No Transportation Needs (11/28/2021)    PRAPARE - Administrator, Civil Service (Medical): No    Lack of Transportation (Non-Medical): No  Physical Activity: Not on file  Stress: Not on file  Social Connections: Not on file    Review of Systems: Gen: Denies fever, chills, anorexia. Denies fatigue, weakness, weight loss.  CV: Denies chest pain, palpitations, syncope, peripheral edema, and claudication. Resp: Denies dyspnea at rest, cough, wheezing, coughing up blood, and pleurisy. GI: Denies vomiting blood, jaundice, and fecal incontinence.   Denies dysphagia or odynophagia. Derm: Denies rash, itching, dry skin Psych: Denies depression,  anxiety, memory loss, confusion. No homicidal or suicidal ideation.  Heme: Denies bruising, bleeding, and enlarged lymph nodes.  Physical Exam: There were no vitals taken for this visit. General:   Alert and oriented. No distress noted. Pleasant and cooperative.  Head:  Normocephalic and atraumatic. Eyes:  Conjuctiva clear without scleral icterus. Heart:  S1, S2 present without murmurs appreciated. Lungs:  Clear to auscultation bilaterally. No wheezes, rales, or rhonchi. No distress.  Abdomen:  +BS, soft, non-tender and non-distended. No rebound or guarding. No HSM or masses noted. Msk:  Symmetrical without gross deformities. Normal posture. Extremities:  Without edema. Neurologic:  Alert and  oriented x4 Psych:  Normal mood and affect.    Assessment:     Plan:  ***   Josette Centers, PA-C [facility] 10/15/2023

## 2023-10-15 ENCOUNTER — Ambulatory Visit: Admitting: Gastroenterology

## 2023-10-28 ENCOUNTER — Ambulatory Visit (INDEPENDENT_AMBULATORY_CARE_PROVIDER_SITE_OTHER): Admitting: Gastroenterology

## 2023-10-28 ENCOUNTER — Encounter: Payer: Self-pay | Admitting: Gastroenterology

## 2023-10-28 VITALS — BP 121/68 | HR 80 | Temp 97.5°F | Ht 66.0 in | Wt 143.2 lb

## 2023-10-28 DIAGNOSIS — K5900 Constipation, unspecified: Secondary | ICD-10-CM | POA: Diagnosis not present

## 2023-10-28 DIAGNOSIS — B9681 Helicobacter pylori [H. pylori] as the cause of diseases classified elsewhere: Secondary | ICD-10-CM | POA: Diagnosis not present

## 2023-10-28 DIAGNOSIS — K625 Hemorrhage of anus and rectum: Secondary | ICD-10-CM | POA: Diagnosis not present

## 2023-10-28 NOTE — Patient Instructions (Signed)
   YOUR PLAN: -CONSTIPATION AND RECTAL BLEEDING: You are experiencing constipation and rectal bleeding, likely due to hemorrhoids in the setting of blood thinners. I do not think your recent bleeding was a diverticular bleed like when you were in the hospital. We need to improve on your constipation, avoid straining as this will increase risk of bleeding hemorrhoids. We will check your blood for anemia. I recommend you start Miralax  one capful twice daily until you have a soft stool, then continue one capful daily. If you have loose stool, you can cut back to 1/2 capful daily.   We did verify, your Creon  did not go into your pill pack because it cost $500 and the pharmacy reports they were not able to reach you to discuss. At this time we will hold off on Creon .  -H. PYLORI INFECTION, TREATED: You were previously treated for an H. pylori infection, which is a type of bacteria that can cause stomach problems. We will confirm that the infection is gone with a breath test.   INSTRUCTIONS: -Please follow up with the blood work to check for anemia.  -Please complete H.pylori breath test at [facility]. You cannot take Pepto Bismol or other acid reducers, or antibiotics within two weeks of taking the test.  -Start Miralax  one capful twice daily until you have a soft stool, then continue one capful daily. If you have loose stool, you can cut back to 1/2 capful daily. You will purchase this over the counter.  -Return office visit in six weeks.

## 2023-10-28 NOTE — Progress Notes (Signed)
 GI Office Note    Referring Provider: Orpha Yancey LABOR, MD Primary Care Physician:  Orpha Yancey LABOR, MD  Primary Gastroenterologist: Dr. Cinderella  Chief Complaint   Chief Complaint  Patient presents with   Constipation    Having issues with constipation and blood in stool    History of Present Illness   Jill Fisher is a 89 y.o. female presenting today for follow up. Last seen 08/2023. She has history of two episodes of ioslated painless rectal bleeding, large volume. First episode 01/2023 and second in 06/2023. She did completed colonoscopy with first episode and found to have 2 tubular adenomas, no active bleeding at time of exam and suspected diverticular bleeding. At last ov, she reported new onset diarrhea since her colonoscopy in 01/2023.   H.pylori diagnosed via EGD 2024, s/p treatment 6/23. Nausea 7/4, lasting 24 hours. Patient stopped treatment. Tried to start her on Creon  for low pancreatic elastase but did not receive medication due to expense ($500).   Discussed the use of AI scribe software for clinical note transcription with the patient, who gave verbal consent to proceed.   She has experienced a significant change in bowel habits following the colonoscopy. Initially, she had diarrhea (postprandially), but now she experiences constipation, having a bowel movement only once every three days. She did not start Creon , as they do not recall being informed about it or receiving it in her medication packs. We spoke to pharmacy [facility]) today and they report Creon  was $500 and they were unable to reach patient to decide if they wanted to get medication.   She describes one recent episode of rectal bleeding, about 1-2 weeks ago. She does not remember straining the day it occurred. Had a BM, noted blood in the toilet and on the tissue. Described as 'pouring blood' and required the use of adult depends for a few days until the bleeding subsided. Just scant blood for few days. Not  as heavy bleeding as prior to her colonoscopy. She was not aware she has hemorrhoids.  She denies any recent heartburn or indigestion. She experiences abdominal pain when constipated. Her dietary intake has been poor, with a lack of appetite leading to minimal food consumption, primarily consisting of one meal a day. She drinks a variety of fluids, including water , lemonade, coffee, and tea, but acknowledges not eating as she should. She enjoys chicken and beans but relies heavily on watermelon and ice cream.  She has a history of anemia, but her hemoglobin levels have not been checked since March. She recalls a previous prescription for hydrocortisone  cream for hemorrhoid discomfort, which she used alongside Vaseline and Desitin, leading to some improvement.      Wt Readings from Last 9 Encounters:  10/28/23 143 lb 3.2 oz (65 kg)  08/27/23 149 lb (67.6 kg)  07/23/23 147 lb 12.8 oz (67 kg)  07/03/23 144 lb 6.4 oz (65.5 kg)  05/28/23 150 lb (68 kg)  01/21/23 149 lb 4.8 oz (67.7 kg)  01/19/23 149 lb 4.8 oz (67.7 kg)  12/10/22 147 lb 3.2 oz (66.8 kg)  11/20/22 147 lb 12.8 oz (67 kg)    Prior Data   09/2023: GIP neg, giardia ag, cryptosporidium Ag, and Cdiff GDH all negative. TSH, TTG IgA, IgA all normal.  Pancreatic elastase 126L  Last colonoscopy 01/22/2023: - Two 3 to 6 mm polyps in the ascending colon, removed with a cold snare. Resected and retrieved.  - Diverticulosis in the entire examined  colon.  - The examined portion of the ileum was normal.  - Non- bleeding external and internal hemorrhoids.  - Likely cause of self limiting GI bleed in this case was diverticular bleed - Pathology with tubular adenoma.   EGD 01/22/2023: - Gastritis. Biopsied.  - Normal duodenal bulb and second portion of the duodenum. -Pathology with chronic active gastritis with intestinal metaplasia positive for H. pylori.   Medications   Current Outpatient Medications  Medication Sig Dispense Refill    apixaban (ELIQUIS) 5 MG TABS tablet Take 1 tablet by mouth 2 (two) times daily.     atorvastatin (LIPITOR) 20 MG tablet Take 20 mg by mouth at bedtime.     furosemide  (LASIX ) 20 MG tablet Take 20 mg by mouth daily.     glimepiride (AMARYL) 1 MG tablet Take 1 mg by mouth daily.     losartan (COZAAR) 25 MG tablet Take 25 mg by mouth daily.     metFORMIN (GLUCOPHAGE) 1000 MG tablet Take 1,000 mg by mouth 2 (two) times daily with a meal.      metoprolol  succinate (TOPROL -XL) 100 MG 24 hr tablet Take 100 mg by mouth every morning.     nitroGLYCERIN  (NITROSTAT ) 0.4 MG SL tablet Place 1 tablet (0.4 mg total) under the tongue every 5 (five) minutes x 3 doses as needed for chest pain (if no relief after 3rd does proceed to ED or call 911). 25 tablet 2   PROCTO-MED HC  2.5 % rectal cream INSERT 1 APPLICATION RECTALLY TWICE DAILY 28 g 1   spironolactone (ALDACTONE) 25 MG tablet Take 25 mg by mouth daily.     No current facility-administered medications for this visit.    Allergies   Allergies as of 10/28/2023 - Review Complete 10/28/2023  Allergen Reaction Noted   Codeine Anxiety and Other (See Comments)        Review of Systems   General: Negative for anorexia,  fever, chills, fatigue, weakness.see hpi ENT: Negative for hoarseness, difficulty swallowing , nasal congestion. CV: Negative for chest pain, angina, palpitations, dyspnea on exertion, peripheral edema.  Respiratory: Negative for dyspnea at rest, dyspnea on exertion, cough, sputum, wheezing.  GI: See history of present illness. GU:  Negative for dysuria, hematuria, urinary incontinence, urinary frequency, nocturnal urination.  Endo: Negative for unusual weight change.     Physical Exam   BP 121/68 (BP Location: Right Arm, Patient Position: Sitting, Cuff Size: Normal)   Pulse 80   Temp (!) 97.5 F (36.4 C) (Oral)   Ht 5' 6 (1.676 m)   Wt 143 lb 3.2 oz (65 kg)   SpO2 97%   BMI 23.11 kg/m    General: Well-nourished,  well-developed in no acute distress. Accompanied by daughter Rojelio Eyes: No icterus. Mouth: Oropharyngeal mucosa moist and pink   Lungs: Clear to auscultation bilaterally.  Heart: Regular rate and rhythm, no murmurs rubs or gallops.  Abdomen: Bowel sounds are normal, nontender, nondistended, no hepatosplenomegaly or masses,  no abdominal bruits or hernia , no rebound or guarding.  Rectal: declined Extremities: No lower extremity edema. No clubbing or deformities. Neuro: Alert and oriented x 4   Skin: Warm and dry, no jaundice.   Psych: Alert and cooperative, normal mood and affect.  Labs   Lab Results  Component Value Date   TSH 2.99 09/02/2023   Lab Results  Component Value Date   NA 141 06/04/2023   CL 105 06/04/2023   K 3.6 06/04/2023   CO2 24 06/04/2023  BUN 14 06/04/2023   CREATININE 1.01 (H) 06/04/2023   GFRNONAA 54 (L) 06/04/2023   CALCIUM 8.8 (L) 06/04/2023   ALBUMIN 3.3 (L) 06/04/2023   GLUCOSE 143 (H) 06/04/2023   Lab Results  Component Value Date   ALT 15 06/04/2023   AST 22 06/04/2023   ALKPHOS 56 06/04/2023   BILITOT 1.3 (H) 06/04/2023   Lab Results  Component Value Date   WBC 6.6 06/04/2023   HGB 12.3 06/04/2023   HCT 38.6 06/04/2023   MCV 88.7 06/04/2023   PLT 189 06/04/2023    Imaging Studies   No results found.  Assessment/Plan:     Constipation and rectal bleeding  Previous diarrhea has resolved. Complaining of intermittent constipation and another episode rectal bleeding, likely exacerbated internal/external hemorrhoids, increased bleeding in setting of blood thinners. Recent bleeding does not appear as significant as during her hospitalizations when she was suspected to have diverticular bleeding. Constipation may be due to dietary factors. - CBC. -start miralax  17 grams twice daily until soft stool, then continue once daily. If stools are too loose, cut back to 1/2 capful daily  - return ov 6 weeks  H. pylori infection,  treated Previously treated for H. pylori infection with antibiotics, which caused nausea and vomiting. Completed approximately 12 days of treatment. Need to confirm eradication of H. pylori infection. - Plan for H. pylori eradication testing using a breath test off acid reflux medications for at least two weeks before testing.              Sonny RAMAN. Ezzard, MHS, PA-C [facility]

## 2023-11-05 ENCOUNTER — Encounter: Payer: Self-pay | Admitting: Nurse Practitioner

## 2023-11-05 ENCOUNTER — Ambulatory Visit: Attending: Nurse Practitioner | Admitting: Nurse Practitioner

## 2023-11-05 VITALS — BP 120/72 | HR 68 | Ht 66.0 in | Wt 147.0 lb

## 2023-11-05 DIAGNOSIS — I38 Endocarditis, valve unspecified: Secondary | ICD-10-CM | POA: Diagnosis not present

## 2023-11-05 DIAGNOSIS — R002 Palpitations: Secondary | ICD-10-CM

## 2023-11-05 DIAGNOSIS — R079 Chest pain, unspecified: Secondary | ICD-10-CM | POA: Diagnosis not present

## 2023-11-05 DIAGNOSIS — E785 Hyperlipidemia, unspecified: Secondary | ICD-10-CM

## 2023-11-05 DIAGNOSIS — I10 Essential (primary) hypertension: Secondary | ICD-10-CM | POA: Diagnosis not present

## 2023-11-05 DIAGNOSIS — I4819 Other persistent atrial fibrillation: Secondary | ICD-10-CM

## 2023-11-05 DIAGNOSIS — K2970 Gastritis, unspecified, without bleeding: Secondary | ICD-10-CM | POA: Diagnosis not present

## 2023-11-05 DIAGNOSIS — K5900 Constipation, unspecified: Secondary | ICD-10-CM | POA: Diagnosis not present

## 2023-11-05 DIAGNOSIS — K625 Hemorrhage of anus and rectum: Secondary | ICD-10-CM | POA: Diagnosis not present

## 2023-11-05 DIAGNOSIS — B9681 Helicobacter pylori [H. pylori] as the cause of diseases classified elsewhere: Secondary | ICD-10-CM | POA: Diagnosis not present

## 2023-11-05 MED ORDER — DILTIAZEM HCL 30 MG PO TABS: 30.0000 mg | ORAL_TABLET | Freq: Two times a day (BID) | ORAL | 0 refills | Status: DC | PRN

## 2023-11-05 NOTE — Patient Instructions (Addendum)
 Medication Instructions:  Your physician has recommended you make the following change in your medication:  Please start Diltiazem  30 Mg twice daily as needed for palpitations.   Labwork: Within 1 week with [facility]   Testing/Procedures: None   Follow-Up: Your physician recommends that you schedule a follow-up appointment in: 6-8 weeks   Any Other Special Instructions Will Be Listed Below (If Applicable).  If you need a refill on your cardiac medications before your next appointment, please call your pharmacy.

## 2023-11-05 NOTE — Progress Notes (Signed)
 Cardiology Office Note:  .   Date: 11/05/2023 ID:  Jill Fisher, DOB January 24, 1935, MRN [id number] PCP: Jill Fisher LABOR, MD  Jill Fisher Cardiologist:  Jill SHAUNNA Maywood, MD    History of Present Illness: .   Jill Fisher is a 89 y.o. female with a PMH of persistent A-fib, PSVT, hypertension, hyperlipidemia, type 2 diabetes, valvular insufficiency, who presents today for scheduled follow-up.  Previously seen by [facility] cardiology for management of her persistent A-fib.  NST in 2020 was negative for ischemia/scar.  Echocardiogram that year showed normal EF, moderate to severe TR, mild to moderate MR.  Last seen by Jill Fisher on November 20, 2022.  Patient noted mild shortness of breath with exertion.  Noted occasional palpitations, few times per month.  Chest pain was only noted with overexertion, have been chronic and ongoing since 2022.  Noted to be A-fib with RVR, 114 bpm on EKG.  Toprol -XL increased from 100 mg to 150 mg once daily.  Echocardiogram was updated and revealed EF 50%, mild MR, mild to moderate TR.  05/28/2023 - Today she presents for scheduled follow-up.  She admits to intermittent dark stools that are better in appearance than previously before she had a colonoscopy performed.  Has not followed up with GI since her colonoscopy in November 2024, admits to some bowel frequency.  Confirms to me she is only taking Toprol -XL 100 mg daily.  Tells me she can tell that she remains in A-fib most of the time, has been ongoing since last summer.  Admits to shortness of breath only with exertion.  Family member in the room confirms heart rate stays mainly in 70s with occasionally heart rate in 110s 120s.  Patient says diltiazem  seems to help when she takes this as needed for palpitations.  Family member admits to patient feeling nervous/jittery when in A-fib, will have to lie down.  Denies any chest pain, syncope, presyncope, dizziness, orthopnea, PND, swelling or significant  weight changes, or claudication.  07/23/2023 - She presents today for follow-up. Did have one episode of A-fib since last office visit, but otherwise is doing fine. Says spell didn't last long, and was able to be resolved with her medications. Still noticing dark stools, and bowel upset after eating. Admits to anal pain and what sounds like possible hemorrhoids. Denies any chest pain, shortness of breath, syncope, presyncope, dizziness, orthopnea, PND, swelling or significant weight changes, or claudication.  11/05/2023 -  Here for follow-up. She admits to episode of chest pain one night, took a nitroglycerin  tablet, and after a while this was resolved. Very rare when she gets chest pain, says her pain went from her back up to her shoulders, and then radiated to the left side of her chest. Admits to stomach pain. Denies any recurrence in her CP. Denies any shortness of breath, syncope, presyncope, dizziness, orthopnea, PND, swelling or significant weight changes, acute bleeding, or claudication. Does admit to some palpitations in regards to her A-fib.   ROS: Negative.  See HPI.  Studies Reviewed: SABRA    EKG:  EKG Interpretation Date/Time:  Thursday November 05 2023 13:25:06 EDT Ventricular Rate:  114 PR Interval:    QRS Duration:  64 QT Interval:  270 QTC Calculation: 372 R Axis:   74  Text Interpretation: Atrial fibrillation with rapid ventricular response Nonspecific ST and T wave abnormality When compared with ECG of 28-May-2023 08:37, No significant change was found Confirmed by Jill Fisher [phone]) on 11/05/2023 1:26:55 PM  Echo 12/2022:  1. Left ventricular ejection fraction, by estimation, is 50%. The left  ventricle has low normal function. Left ventricular endocardial border not  optimally defined to evaluate regional wall motion. Left ventricular  diastolic function could not be  evaluated.   2. Right ventricular systolic function is normal. The right ventricular  size is normal.  There is normal pulmonary artery systolic pressure.   3. The mitral valve is normal in structure. Mild mitral valve  regurgitation. No evidence of mitral stenosis.   4. The tricuspid valve is abnormal. Tricuspid valve regurgitation is mild  to moderate.   5. The aortic valve is tricuspid. There is mild calcification of the  aortic valve. Aortic valve regurgitation is not visualized. No aortic  stenosis is present.   6. The inferior vena cava is normal in size with greater than 50%  respiratory variability, suggesting right atrial pressure of 3 mmHg.   Comparison(s): No prior Echocardiogram.  Physical Exam:   VS:  BP 120/72   Pulse 68   Ht 5' 6 (1.676 m)   Wt 147 lb (66.7 kg)   SpO2 99%   BMI 23.73 kg/m    Wt Readings from Last 3 Encounters:  11/05/23 147 lb (66.7 kg)  10/28/23 143 lb 3.2 oz (65 kg)  08/27/23 149 lb (67.6 kg)    GEN: Thin, 89 year old female in no acute distress NECK: No JVD; No carotid bruits CARDIAC: S1/S2, irregularly irregular rhythm, no murmurs, rubs, gallops RESPIRATORY:  Clear and diminished to auscultation without rales, wheezing or rhonchi  ABDOMEN: Soft, non-tender, non-distended EXTREMITIES:  No edema; No deformity   ASSESSMENT AND PLAN: .    Chest pain of uncertain etiology Presentation sounds atypical, very rare in occurrence. Did discuss tx options and came to shared medical decision that we will monitor for now. EKG was negative today for any acute ischemic changes. If no improvement by follow-up, plan to discuss ischemic evaluation. No medication changes at this time. Care and ED precautions discussed.   Persistent A-fib, palpitations HR is well controlled today during office visit, does admit to some palpitations in regards to her A-fib. Continue Eliquis 5 mg BID for stroke prevention - Jill Fisher previously consulted and agreed with this dosage via Epic. Heart healthy diet encouraged.  Recommended [employer]ia mobile app.  Care and ED  precautions discussed. Previously has done well on short acting Diltiazem . Will begin Diltiazem  30 mg BID PRN for palpitations. Will obtain BMET.   HTN BP stable. Discussed to monitor BP at home at least 2 hours after medications and sitting for 5-10 minutes. Continue current medication regimen.  Heart healthy diet recommended.  HLD LDL 45 in 07/2022.  Continue atorvastatin.  Being managed by PCP.  Continue follow-up with PCP.  Heart healthy diet recommended.  Valvular insufficiency  Mild mitral valve regurgitation noted on echocardiogram in October 2024.  Mild to moderate TR also noted.  Plan to update echocardiogram in the next 1-2 years or sooner if clinically indicated.  I spent a total duration of 30 minutes reviewing prior notes, reviewing outside records including  labs, EKG today, face-to-face counseling of medical condition, pathophysiology, evaluation, management, and documenting the findings in the note.    Dispo: Care and ED precautions discussed. Follow-up with MD/APP in 3-4 months or sooner if anything changes.  Signed, Almarie Crate, NP

## 2023-11-06 LAB — CBC WITH DIFFERENTIAL/PLATELET
Basophils Absolute: 0.1 x10E3/uL (ref 0.0–0.2)
Basos: 1 %
EOS (ABSOLUTE): 0.4 x10E3/uL (ref 0.0–0.4)
Eos: 3 %
Hematocrit: 40.2 % (ref 34.0–46.6)
Hemoglobin: 13 g/dL (ref 11.1–15.9)
Immature Grans (Abs): 0 x10E3/uL (ref 0.0–0.1)
Immature Granulocytes: 0 %
Lymphocytes Absolute: 2.7 x10E3/uL (ref 0.7–3.1)
Lymphs: 18 %
MCH: 28.8 pg (ref 26.6–33.0)
MCHC: 32.3 g/dL (ref 31.5–35.7)
MCV: 89 fL (ref 79–97)
Monocytes Absolute: 1.1 x10E3/uL — ABNORMAL HIGH (ref 0.1–0.9)
Monocytes: 7 %
Neutrophils Absolute: 10.6 x10E3/uL — ABNORMAL HIGH (ref 1.4–7.0)
Neutrophils: 71 %
Platelets: 220 x10E3/uL (ref 150–450)
RBC: 4.52 x10E6/uL (ref 3.77–5.28)
RDW: 14.3 % (ref 11.7–15.4)
WBC: 14.9 x10E3/uL — ABNORMAL HIGH (ref 3.4–10.8)

## 2023-11-06 LAB — BASIC METABOLIC PANEL WITH GFR
BUN/Creatinine Ratio: 17 (ref 12–28)
BUN: 15 mg/dL (ref 8–27)
CO2: 22 mmol/L (ref 20–29)
Calcium: 9 mg/dL (ref 8.7–10.3)
Chloride: 104 mmol/L (ref 96–106)
Creatinine, Ser: 0.87 mg/dL (ref 0.57–1.00)
Glucose: 69 mg/dL — ABNORMAL LOW (ref 70–99)
Potassium: 4.1 mmol/L (ref 3.5–5.2)
Sodium: 143 mmol/L (ref 134–144)
eGFR: 64 mL/min/1.73 (ref >59)

## 2023-11-06 LAB — H. PYLORI BREATH TEST

## 2023-11-07 ENCOUNTER — Ambulatory Visit: Payer: Self-pay | Admitting: Gastroenterology

## 2023-11-10 ENCOUNTER — Telehealth: Payer: Self-pay | Admitting: Nurse Practitioner

## 2023-11-10 NOTE — Telephone Encounter (Signed)
-----   Message from Almarie Crate sent at 11/10/2023 10:58 AM EDT ----- Her labs look good. Blood sugar was a hair low, needs to follow-up with PCP regarding this.   Thanks!   Best, Almarie Crate, NP

## 2023-11-10 NOTE — Telephone Encounter (Signed)
 Patient informed and verbalized understanding of plan.

## 2023-11-16 DIAGNOSIS — E1122 Type 2 diabetes mellitus with diabetic chronic kidney disease: Secondary | ICD-10-CM | POA: Diagnosis not present

## 2023-11-16 DIAGNOSIS — I480 Paroxysmal atrial fibrillation: Secondary | ICD-10-CM | POA: Diagnosis not present

## 2023-11-16 DIAGNOSIS — Z6823 Body mass index (BMI) 23.0-23.9, adult: Secondary | ICD-10-CM | POA: Diagnosis not present

## 2023-11-16 DIAGNOSIS — N1831 Chronic kidney disease, stage 3a: Secondary | ICD-10-CM | POA: Diagnosis not present

## 2023-11-16 DIAGNOSIS — K5900 Constipation, unspecified: Secondary | ICD-10-CM | POA: Diagnosis not present

## 2023-11-16 DIAGNOSIS — I10 Essential (primary) hypertension: Secondary | ICD-10-CM | POA: Diagnosis not present

## 2023-11-16 DIAGNOSIS — E7849 Other hyperlipidemia: Secondary | ICD-10-CM | POA: Diagnosis not present

## 2023-11-16 DIAGNOSIS — L0390 Cellulitis, unspecified: Secondary | ICD-10-CM | POA: Diagnosis not present

## 2023-11-16 DIAGNOSIS — I5032 Chronic diastolic (congestive) heart failure: Secondary | ICD-10-CM | POA: Diagnosis not present

## 2023-11-16 DIAGNOSIS — M818 Other osteoporosis without current pathological fracture: Secondary | ICD-10-CM | POA: Diagnosis not present

## 2023-12-09 ENCOUNTER — Ambulatory Visit: Admitting: Gastroenterology

## 2023-12-09 NOTE — Progress Notes (Deleted)
 GI Office Note    Referring Provider: Orpha Yancey LABOR, MD Primary Care Physician:  Orpha Yancey LABOR, MD  Primary Gastroenterologist: Dr. Cinderella  Chief Complaint   No chief complaint on file.   History of Present Illness   Jill Fisher is a 89 y.o. female presenting today  for follow up. Last seen 10/2023. She has history of two episodes of ioslated painless rectal bleeding, large volume. First episode 01/2023 and second in 06/2023. She did completed colonoscopy with first episode and found to have 2 tubular adenomas, no active bleeding at time of exam and suspected diverticular bleeding. At last ov, she reported new onset diarrhea since her colonoscopy in 01/2023.    H.pylori diagnosed via EGD 2024, s/p treatment 6/23. Nausea 7/4, lasting 24 hours. Patient stopped treatment. Tried to start her on Creon  for low pancreatic elastase but did not receive medication due to expense ($500).    Tried to confirm H.pylori eradication since last ov. H.pylori breat test could not be completed due to bag being deflated. We have been unable to reach patient to let her know she needs retesting and plan for stool ag this time.  Prior Data   Labs 10/2023:  Labs 09/2023: GIP neg, giardia ag, cryptosporidium Ag, and Cdiff GDH all negative. TSH, TTG IgA, IgA all normal.   Pancreatic elastase 126L   Last colonoscopy 01/22/2023: - Two 3 to 6 mm polyps in the ascending colon, removed with a cold snare. Resected and retrieved.  - Diverticulosis in the entire examined colon.  - The examined portion of the ileum was normal.  - Non- bleeding external and internal hemorrhoids.  - Likely cause of self limiting GI bleed in this case was diverticular bleed - Pathology with tubular adenoma.   EGD 01/22/2023: - Gastritis. Biopsied.  - Normal duodenal bulb and second portion of the duodenum. -Pathology with chronic active gastritis with intestinal metaplasia positive for H. pylori.     Medications    Current Outpatient Medications  Medication Sig Dispense Refill   alendronate (FOSAMAX) 70 MG tablet Take 70 mg by mouth once a week. Take with a full glass of water  on an empty stomach.     apixaban (ELIQUIS) 5 MG TABS tablet Take 1 tablet by mouth 2 (two) times daily.     atorvastatin (LIPITOR) 20 MG tablet Take 20 mg by mouth at bedtime.     diltiazem  (CARDIZEM ) 30 MG tablet Take 1 tablet (30 mg total) by mouth 2 (two) times daily as needed (Palpitations). 180 tablet 0   furosemide  (LASIX ) 20 MG tablet Take 20 mg by mouth daily.     glimepiride (AMARYL) 1 MG tablet Take 1 mg by mouth daily.     losartan (COZAAR) 25 MG tablet Take 25 mg by mouth daily.     metFORMIN (GLUCOPHAGE) 1000 MG tablet Take 1,000 mg by mouth 2 (two) times daily with a meal.      metoprolol  succinate (TOPROL -XL) 100 MG 24 hr tablet Take 100 mg by mouth every morning.     nitroGLYCERIN  (NITROSTAT ) 0.4 MG SL tablet Place 1 tablet (0.4 mg total) under the tongue every 5 (five) minutes x 3 doses as needed for chest pain (if no relief after 3rd does proceed to ED or call 911). 25 tablet 2   PROCTO-MED HC  2.5 % rectal cream INSERT 1 APPLICATION RECTALLY TWICE DAILY 28 g 1   spironolactone (ALDACTONE) 25 MG tablet Take 25 mg by mouth daily.  No current facility-administered medications for this visit.    Allergies   Allergies as of 12/09/2023 - Review Complete 11/05/2023  Allergen Reaction Noted   Codeine Anxiety and Other (See Comments)      Past Medical History   Past Medical History:  Diagnosis Date   A-fib (HCC)    Allergic arthritis of shoulder region, right    Cerebrovascular disease    followed at [facility]   Coronary artery disease    LVEF  65%   Diabetes mellitus without complication (HCC)    Type II   GERD (gastroesophageal reflux disease)    Hyperlipidemia    Hypertension     Past Surgical History   Past Surgical History:  Procedure Laterality Date   APPENDECTOMY     BIOPSY  01/22/2023    Procedure: BIOPSY;  Surgeon: Cinderella Deatrice FALCON, MD;  Location: [facility];  Service: Endoscopy;;   CARDIAC CATHETERIZATION     CAROTID ENDARTERECTOMY     left   CAROTID STENT INSERTION Left    CATARACT EXTRACTION W/PHACO Right 11/30/2012   Procedure: CATARACT EXTRACTION PHACO AND INTRAOCULAR LENS PLACEMENT (IOC);  Surgeon: Oneil T. Roz, MD;  Location: [facility];  Service: Ophthalmology;  Laterality: Right;  CDE:12.71   CATARACT EXTRACTION W/PHACO Left 12/21/2012   Procedure: CATARACT EXTRACTION PHACO AND INTRAOCULAR LENS PLACEMENT (IOC);  Surgeon: Oneil T. Roz, MD;  Location: [facility];  Service: Ophthalmology;  Laterality: Left;  CDE:12.65   CHOLECYSTECTOMY     COLONOSCOPY N/A 06/29/2012   Procedure: COLONOSCOPY;  Surgeon: Claudis RAYMOND Rivet, MD;  [facility]: [facility];  Service: Endoscopy;  Laterality: N/A;  1200-moved to 1215 [name] to notify pt   COLONOSCOPY WITH PROPOFOL  N/A 01/22/2023   Procedure: COLONOSCOPY WITH PROPOFOL ;  Surgeon: Cinderella Deatrice FALCON, MD;  [facility]: [facility];  Service: Endoscopy;  Laterality: N/A;  9:30AM;ASA 3   ESOPHAGOGASTRODUODENOSCOPY (EGD) WITH PROPOFOL  N/A 01/22/2023   Procedure: ESOPHAGOGASTRODUODENOSCOPY (EGD) WITH PROPOFOL ;  Surgeon: Cinderella Deatrice FALCON, MD;  [facility];  Service: Endoscopy;  Laterality: N/A;  9:30AM;ASA 3   POLYPECTOMY  01/22/2023   Procedure: POLYPECTOMY;  Surgeon: Cinderella Deatrice FALCON, MD;  [facility];  Service: Endoscopy;;   REVERSE SHOULDER ARTHROPLASTY Right 09/03/2017   Procedure: REVERSE RIGHT SHOULDER ARTHROPLASTY;  Surgeon: Melita Drivers, MD;  Location: [facility];  Service: Orthopedics;  Laterality: Right;   YAG LASER APPLICATION Right 07/03/2015   Procedure: YAG LASER APPLICATION;  Surgeon: Oneil Roz, MD;  Location: [facility];  Service: Ophthalmology;  Laterality: Right;   YAG LASER APPLICATION Left 07/17/2015   Procedure: YAG LASER APPLICATION;  Surgeon: Oneil Roz, MD;  Location: [facility];  Service: Ophthalmology;   Laterality: Left;    Past Family History   Family History  Problem Relation Age of Onset   Heart disease Mother    Cerebrovascular Disease Father    Cancer Sister    Cerebrovascular Disease Brother    Diabetes Brother    Heart attack Brother    Liver disease Brother     Past Social History   Social History   Socioeconomic History   Marital status: Widowed    Spouse name: Not on file   Number of children: 2   Years of education: HS   Highest education level: Not on file  Occupational History   Not on file  Tobacco Use   Smoking status: Never   Smokeless tobacco: Never  Vaping Use   Vaping status: Never Used  Substance  and Sexual Activity   Alcohol use: No   Drug use: No   Sexual activity: Not on file  Other Topics Concern   Not on file  Social History Narrative   Not on file   Social Drivers of Health   Financial Resource Strain: Not on file  Food Insecurity: Food Insecurity Present (11/29/2021)   Hunger Vital Sign    Worried About Running Out of Food in the Last Year: Sometimes true    Ran Out of Food in the Last Year: Sometimes true  Transportation Needs: No Transportation Needs (11/28/2021)   PRAPARE - Administrator, Civil Service (Medical): No    Lack of Transportation (Non-Medical): No  Physical Activity: Not on file  Stress: Not on file  Social Connections: Not on file  Intimate Partner Violence: Not on file    Review of Systems   General: Negative for anorexia, weight loss, fever, chills, fatigue, weakness. ENT: Negative for hoarseness, difficulty swallowing , nasal congestion. CV: Negative for chest pain, angina, palpitations, dyspnea on exertion, peripheral edema.  Respiratory: Negative for dyspnea at rest, dyspnea on exertion, cough, sputum, wheezing.  GI: See history of present illness. GU:  Negative for dysuria, hematuria, urinary incontinence, urinary frequency, nocturnal urination.  Endo: Negative for unusual weight change.      Physical Exam   There were no vitals taken for this visit.   General: Well-nourished, well-developed in no acute distress.  Eyes: No icterus. Mouth: Oropharyngeal mucosa moist and pink   Lungs: Clear to auscultation bilaterally.  Heart: Regular rate and rhythm, no murmurs rubs or gallops.  Abdomen: Bowel sounds are normal, nontender, nondistended, no hepatosplenomegaly or masses,  no abdominal bruits or hernia , no rebound or guarding.  Rectal: not performed Extremities: No lower extremity edema. No clubbing or deformities. Neuro: Alert and oriented x 4   Skin: Warm and dry, no jaundice.   Psych: Alert and cooperative, normal mood and affect.  Labs   *** Imaging Studies   No results found.  Assessment/Plan:      Constipation and rectal bleeding  Previous diarrhea has resolved. Complaining of intermittent constipation and another episode rectal bleeding, likely exacerbated internal/external hemorrhoids, increased bleeding in setting of blood thinners. Recent bleeding does not appear as significant as during her hospitalizations when she was suspected to have diverticular bleeding. Constipation may be due to dietary factors. - CBC. -start miralax  17 grams twice daily until soft stool, then continue once daily. If stools are too loose, cut back to 1/2 capful daily  - return ov 6 weeks   H. pylori infection, treated Previously treated for H. pylori infection with antibiotics, which caused nausea and vomiting. Completed approximately 12 days of treatment. Need to confirm eradication of H. pylori infection. - Plan for H. pylori eradication testing using a breath test off acid reflux medications for at least two weeks before testing.                Sonny RAMAN. Ezzard, MHS, PA-C [facility]

## 2023-12-10 ENCOUNTER — Encounter: Payer: Self-pay | Admitting: Gastroenterology

## 2023-12-14 DIAGNOSIS — E113393 Type 2 diabetes mellitus with moderate nonproliferative diabetic retinopathy without macular edema, bilateral: Secondary | ICD-10-CM | POA: Diagnosis not present

## 2023-12-15 ENCOUNTER — Ambulatory Visit: Payer: Self-pay | Admitting: Nurse Practitioner

## 2023-12-29 DIAGNOSIS — E7849 Other hyperlipidemia: Secondary | ICD-10-CM | POA: Diagnosis not present

## 2023-12-29 DIAGNOSIS — Z6823 Body mass index (BMI) 23.0-23.9, adult: Secondary | ICD-10-CM | POA: Diagnosis not present

## 2023-12-29 DIAGNOSIS — I5032 Chronic diastolic (congestive) heart failure: Secondary | ICD-10-CM | POA: Diagnosis not present

## 2023-12-29 DIAGNOSIS — I10 Essential (primary) hypertension: Secondary | ICD-10-CM | POA: Diagnosis not present

## 2023-12-29 DIAGNOSIS — K5900 Constipation, unspecified: Secondary | ICD-10-CM | POA: Diagnosis not present

## 2023-12-29 DIAGNOSIS — F331 Major depressive disorder, recurrent, moderate: Secondary | ICD-10-CM | POA: Diagnosis not present

## 2023-12-29 DIAGNOSIS — M818 Other osteoporosis without current pathological fracture: Secondary | ICD-10-CM | POA: Diagnosis not present

## 2023-12-29 DIAGNOSIS — I480 Paroxysmal atrial fibrillation: Secondary | ICD-10-CM | POA: Diagnosis not present

## 2023-12-29 DIAGNOSIS — N182 Chronic kidney disease, stage 2 (mild): Secondary | ICD-10-CM | POA: Diagnosis not present

## 2023-12-31 ENCOUNTER — Ambulatory Visit: Attending: Nurse Practitioner | Admitting: Nurse Practitioner

## 2023-12-31 NOTE — Progress Notes (Deleted)
 Cardiology Office Note:  .   Date: 11/05/2023 ID:  Jill Fisher, DOB [birth year], MRN [id number] PCP: Orpha Yancey LABOR, MD  [facility] Providers Cardiologist:  Diannah SHAUNNA Maywood, MD    History of Present Illness: .   Jill Fisher is a 89 y.o. female with a PMH of persistent A-fib, PSVT, hypertension, hyperlipidemia, type 2 diabetes, valvular insufficiency, who presents today for scheduled follow-up.  Previously seen by [facility] cardiology for management of her persistent A-fib.  NST in 2020 was negative for ischemia/scar.  Echocardiogram that year showed normal EF, moderate to severe TR, mild to moderate MR.  Last seen by Dr. Mallipeddi on November 20, 2022.  Patient noted mild shortness of breath with exertion.  Noted occasional palpitations, few times per month.  Chest pain was only noted with overexertion, have been chronic and ongoing since 2022.  Noted to be A-fib with RVR, 114 bpm on EKG.  Toprol -XL increased from 100 mg to 150 mg once daily.  Echocardiogram was updated and revealed EF 50%, mild MR, mild to moderate TR.  05/28/2023 - Today she presents for scheduled follow-up.  She admits to intermittent dark stools that are better in appearance than previously before she had a colonoscopy performed.  Has not followed up with GI since her colonoscopy in November 2024, admits to some bowel frequency.  Confirms to me she is only taking Toprol -XL 100 mg daily.  Tells me she can tell that she remains in A-fib most of the time, has been ongoing since last summer.  Admits to shortness of breath only with exertion.  Family member in the room confirms heart rate stays mainly in 70s with occasionally heart rate in 110s 120s.  Patient says diltiazem  seems to help when she takes this as needed for palpitations.  Family member admits to patient feeling nervous/jittery when in A-fib, will have to lie down.  Denies any chest pain, syncope, presyncope, dizziness, orthopnea, PND, swelling or significant  weight changes, or claudication.  07/23/2023 - She presents today for follow-up. Did have one episode of A-fib since last office visit, but otherwise is doing fine. Says spell didn't last long, and was able to be resolved with her medications. Still noticing dark stools, and bowel upset after eating. Admits to anal pain and what sounds like possible hemorrhoids. Denies any chest pain, shortness of breath, syncope, presyncope, dizziness, orthopnea, PND, swelling or significant weight changes, or claudication.  11/05/2023 -  Here for follow-up. She admits to episode of chest pain one night, took a nitroglycerin  tablet, and after a while this was resolved. Very rare when she gets chest pain, says her pain went from her back up to her shoulders, and then radiated to the left side of her chest. Admits to stomach pain. Denies any recurrence in her CP. Denies any shortness of breath, syncope, presyncope, dizziness, orthopnea, PND, swelling or significant weight changes, acute bleeding, or claudication. Does admit to some palpitations in regards to her A-fib.   ROS: Negative.  See HPI.  Studies Reviewed: SABRA    EKG:      Echo 12/2022:  1. Left ventricular ejection fraction, by estimation, is 50%. The left  ventricle has low normal function. Left ventricular endocardial border not  optimally defined to evaluate regional wall motion. Left ventricular  diastolic function could not be  evaluated.   2. Right ventricular systolic function is normal. The right ventricular  size is normal. There is normal pulmonary artery systolic pressure.  3. The mitral valve is normal in structure. Mild mitral valve  regurgitation. No evidence of mitral stenosis.   4. The tricuspid valve is abnormal. Tricuspid valve regurgitation is mild  to moderate.   5. The aortic valve is tricuspid. There is mild calcification of the  aortic valve. Aortic valve regurgitation is not visualized. No aortic  stenosis is present.   6. The  inferior vena cava is normal in size with greater than 50%  respiratory variability, suggesting right atrial pressure of 3 mmHg.   Comparison(s): No prior Echocardiogram.  Physical Exam:   VS:  There were no vitals taken for this visit.   Wt Readings from Last 3 Encounters:  11/05/23 147 lb (66.7 kg)  10/28/23 143 lb 3.2 oz (65 kg)  08/27/23 149 lb (67.6 kg)    GEN: Thin, 89 year old female in no acute distress NECK: No JVD; No carotid bruits CARDIAC: S1/S2, irregularly irregular rhythm, no murmurs, rubs, gallops RESPIRATORY:  Clear and diminished to auscultation without rales, wheezing or rhonchi  ABDOMEN: Soft, non-tender, non-distended EXTREMITIES:  No edema; No deformity   ASSESSMENT AND PLAN: .    Chest pain of uncertain etiology Presentation sounds atypical, very rare in occurrence. Did discuss tx options and came to shared medical decision that we will monitor for now. EKG was negative today for any acute ischemic changes. If no improvement by follow-up, plan to discuss ischemic evaluation. No medication changes at this time. Care and ED precautions discussed.   Persistent A-fib, palpitations HR is well controlled today during office visit, does admit to some palpitations in regards to her A-fib. Continue Eliquis 5 mg BID for stroke prevention - Dr. Mallipeddi previously consulted and agreed with this dosage via Epic. Heart healthy diet encouraged.  Recommended Kardia mobile app.  Care and ED precautions discussed. Previously has done well on short acting Diltiazem . Will begin Diltiazem  30 mg BID PRN for palpitations. Will obtain BMET.   HTN BP stable. Discussed to monitor BP at home at least 2 hours after medications and sitting for 5-10 minutes. Continue current medication regimen.  Heart healthy diet recommended.  HLD LDL 45 in 07/2022.  Continue atorvastatin.  Being managed by PCP.  Continue follow-up with PCP.  Heart healthy diet recommended.  Valvular insufficiency  Mild  mitral valve regurgitation noted on echocardiogram in October 2024.  Mild to moderate TR also noted.  Plan to update echocardiogram in the next 1-2 years or sooner if clinically indicated.  I spent a total duration of 30 minutes reviewing prior notes, reviewing outside records including  labs, EKG today, face-to-face counseling of medical condition, pathophysiology, evaluation, management, and documenting the findings in the note.    Dispo: Care and ED precautions discussed. Follow-up with MD/APP in 3-4 months or sooner if anything changes.  Signed, Almarie Crate, NP

## 2024-02-07 ENCOUNTER — Emergency Department (HOSPITAL_COMMUNITY)

## 2024-02-07 ENCOUNTER — Other Ambulatory Visit: Payer: Self-pay

## 2024-02-07 ENCOUNTER — Emergency Department (HOSPITAL_COMMUNITY)
Admission: EM | Admit: 2024-02-07 | Discharge: 2024-02-07 | Disposition: A | Discharge status: Home / Self Care | Attending: Emergency Medicine | Admitting: Emergency Medicine

## 2024-02-07 DIAGNOSIS — Z7901 Long term (current) use of anticoagulants: Secondary | ICD-10-CM | POA: Diagnosis not present

## 2024-02-07 DIAGNOSIS — I509 Heart failure, unspecified: Secondary | ICD-10-CM | POA: Diagnosis not present

## 2024-02-07 DIAGNOSIS — R059 Cough, unspecified: Secondary | ICD-10-CM | POA: Diagnosis not present

## 2024-02-07 DIAGNOSIS — I5020 Unspecified systolic (congestive) heart failure: Secondary | ICD-10-CM | POA: Diagnosis not present

## 2024-02-07 DIAGNOSIS — I110 Hypertensive heart disease with heart failure: Secondary | ICD-10-CM | POA: Diagnosis not present

## 2024-02-07 LAB — COMPREHENSIVE METABOLIC PANEL WITH GFR
ALT: 30 U/L (ref 0–44)
AST: 42 U/L — ABNORMAL HIGH (ref 15–41)
Albumin: 3.7 g/dL (ref 3.5–5.0)
Alkaline Phosphatase: 86 U/L (ref 38–126)
Anion gap: 13 (ref 5–15)
BUN: 17 mg/dL (ref 8–23)
CO2: 22 mmol/L (ref 22–32)
Calcium: 9.2 mg/dL (ref 8.9–10.3)
Chloride: 109 mmol/L (ref 98–111)
Creatinine, Ser: 0.9 mg/dL (ref 0.44–1.00)
GFR, Estimated: >60 mL/min
Glucose, Bld: 161 mg/dL — ABNORMAL HIGH (ref 70–99)
Potassium: 3.3 mmol/L — ABNORMAL LOW (ref 3.5–5.1)
Sodium: 144 mmol/L (ref 135–145)
Total Bilirubin: 1.3 mg/dL — ABNORMAL HIGH (ref 0.0–1.2)
Total Protein: 6.1 g/dL — ABNORMAL LOW (ref 6.5–8.1)

## 2024-02-07 LAB — CBC WITH DIFFERENTIAL/PLATELET
Abs Immature Granulocytes: 0.02 K/uL (ref 0.00–0.07)
Basophils Absolute: 0.1 K/uL (ref 0.0–0.1)
Basophils Relative: 1 %
Eosinophils Absolute: 0.3 K/uL (ref 0.0–0.5)
Eosinophils Relative: 3 %
HCT: 37.3 % (ref 36.0–46.0)
Hemoglobin: 11.7 g/dL — ABNORMAL LOW (ref 12.0–15.0)
Immature Granulocytes: 0 %
Lymphocytes Relative: 27 %
Lymphs Abs: 2.2 K/uL (ref 0.7–4.0)
MCH: 26 pg (ref 26.0–34.0)
MCHC: 31.4 g/dL (ref 30.0–36.0)
MCV: 82.9 fL (ref 80.0–100.0)
Monocytes Absolute: 0.9 K/uL (ref 0.1–1.0)
Monocytes Relative: 11 %
Neutro Abs: 4.7 K/uL (ref 1.7–7.7)
Neutrophils Relative %: 58 %
Platelets: 185 K/uL (ref 150–400)
RBC: 4.5 MIL/uL (ref 3.87–5.11)
RDW: 14.7 % (ref 11.5–15.5)
WBC: 8.1 K/uL (ref 4.0–10.5)
nRBC: 0 % (ref 0.0–0.2)

## 2024-02-07 LAB — PRO BRAIN NATRIURETIC PEPTIDE: Pro Brain Natriuretic Peptide: 2349 pg/mL — ABNORMAL HIGH (ref <300.0)

## 2024-02-07 LAB — TROPONIN T, HIGH SENSITIVITY
Troponin T High Sensitivity: 23 ng/L — ABNORMAL HIGH (ref 0–19)
Troponin T High Sensitivity: 20 ng/L — ABNORMAL HIGH (ref 0–19)

## 2024-02-07 MED ORDER — POTASSIUM CHLORIDE CRYS ER 20 MEQ PO TBCR
40.0000 meq | EXTENDED_RELEASE_TABLET | Freq: Once | ORAL | Status: AC
  Administered 2024-02-07: 40 meq via ORAL
  Filled 2024-02-07: qty 2

## 2024-02-07 MED ORDER — FUROSEMIDE 10 MG/ML IJ SOLN
40.0000 mg | Freq: Once | INTRAMUSCULAR | Status: AC
  Administered 2024-02-07: 40 mg via INTRAVENOUS
  Filled 2024-02-07: qty 4

## 2024-02-07 MED ORDER — DILTIAZEM HCL 30 MG PO TABS
30.0000 mg | ORAL_TABLET | Freq: Once | ORAL | Status: AC
  Administered 2024-02-07: 30 mg via ORAL
  Filled 2024-02-07: qty 1

## 2024-02-07 NOTE — ED Triage Notes (Signed)
 Pt c/o cough for 2 days; pt states cough sends her into afib. States unable to sleep for 2 dues d/t cough. A&Ox4.

## 2024-02-07 NOTE — Discharge Instructions (Signed)
 Follow-up with your family doctor this week for recheck.  Increase your Lasix  so you take 2 pills in the morning instead of 1 in the morning and do that for 2 days.  You have also been referred to Dr. Alvan for cardiology and you can follow-up with him or one of his colleagues

## 2024-02-09 DIAGNOSIS — N182 Chronic kidney disease, stage 2 (mild): Secondary | ICD-10-CM | POA: Diagnosis not present

## 2024-02-09 DIAGNOSIS — Z6824 Body mass index (BMI) 24.0-24.9, adult: Secondary | ICD-10-CM | POA: Diagnosis not present

## 2024-02-09 NOTE — ED Provider Notes (Signed)
 Jill Fisher EMERGENCY DEPARTMENT AT [facility] Provider Note   CSN: [id number] Arrival date & time: 02/07/24  9092     Patient presents with: Cough   Jill Fisher is a 89 y.o. female.   Patient complains of cough and some shortness of breath.  She has a history of atrial fibs and mild congestive heart failure  The history is provided by the patient and medical records. No language interpreter was used.  Cough Cough characteristics:  Non-productive Sputum characteristics:  Nondescript Severity:  Mild Timing:  Intermittent Progression:  Waxing and waning Chronicity:  New Smoker: no   Context: not animal exposure   Relieved by:  Nothing Worsened by:  Nothing Associated symptoms: shortness of breath   Associated symptoms: no chest pain, no eye discharge, no headaches and no rash        Prior to Admission medications   Medication Sig Start Date End Date Taking? Authorizing Provider  alendronate (FOSAMAX) 70 MG tablet Take 70 mg by mouth once a week. Take with a full glass of water  on an empty stomach.    [provider]  apixaban (ELIQUIS) 5 MG TABS tablet Take 1 tablet by mouth 2 (two) times daily. 07/25/22   [provider]  atorvastatin (LIPITOR) 20 MG tablet Take 20 mg by mouth at bedtime.    [provider]  diltiazem  (CARDIZEM ) 30 MG tablet Take 1 tablet (30 mg total) by mouth 2 (two) times daily as needed (Palpitations). 11/05/23   Miriam Norris, NP  furosemide  (LASIX ) 20 MG tablet Take 20 mg by mouth daily.    [provider]  glimepiride (AMARYL) 1 MG tablet Take 1 mg by mouth daily.    [provider]  losartan (COZAAR) 25 MG tablet Take 25 mg by mouth daily.    [provider]  metFORMIN (GLUCOPHAGE) 1000 MG tablet Take 1,000 mg by mouth 2 (two) times daily with a meal.  05/30/16   [provider]  metoprolol  succinate (TOPROL -XL) 100 MG 24 hr tablet Take 100 mg by mouth every morning.     [provider]  nitroGLYCERIN  (NITROSTAT ) 0.4 MG SL tablet Place 1 tablet (0.4 mg total) under the tongue every 5 (five) minutes x 3 doses as needed for chest pain (if no relief after 3rd does proceed to ED or call 911). 11/20/22   Mallipeddi, Vishnu P, MD  PROCTO-MED HC  2.5 % rectal cream INSERT 1 APPLICATION RECTALLY TWICE DAILY 09/25/23   Harper, Kristen S, PA-C  spironolactone (ALDACTONE) 25 MG tablet Take 25 mg by mouth daily. 04/22/23   [provider]    Allergies: Codeine    Review of Systems  Constitutional:  Negative for appetite change and fatigue.  HENT:  Negative for congestion, ear discharge and sinus pressure.   Eyes:  Negative for discharge.  Respiratory:  Positive for cough and shortness of breath.   Cardiovascular:  Negative for chest pain.  Gastrointestinal:  Negative for abdominal pain and diarrhea.  Genitourinary:  Negative for frequency and hematuria.  Musculoskeletal:  Negative for back pain.  Skin:  Negative for rash.  Neurological:  Negative for seizures and headaches.  Psychiatric/Behavioral:  Negative for hallucinations.     Updated Vital Signs BP 125/62   Pulse 74   Temp 97.8 F (36.6 C) (Oral)   Resp 15   Ht 5' 6 (1.676 m)   Wt 65.8 kg   SpO2 94%   BMI 23.40 kg/m   Physical Exam Vitals  and nursing note reviewed.  Constitutional:      Appearance: She is well-developed.  HENT:     Head: Normocephalic.     Nose: Nose normal.  Eyes:     General: No scleral icterus.    Conjunctiva/sclera: Conjunctivae normal.  Neck:     Thyroid : No thyromegaly.  Cardiovascular:     Rate and Rhythm: Tachycardia present. Rhythm irregular.     Heart sounds: No murmur heard.    No friction rub. No gallop.  Pulmonary:     Breath sounds: No stridor. No wheezing or rales.  Chest:     Chest wall: No tenderness.  Abdominal:     General: There is no distension.     Tenderness: There is no abdominal tenderness. There is no rebound.   Musculoskeletal:        General: Normal range of motion.     Cervical back: Neck supple.  Lymphadenopathy:     Cervical: No cervical adenopathy.  Skin:    Findings: No erythema or rash.  Neurological:     Mental Status: She is alert and oriented to person, place, and time.     Motor: No abnormal muscle tone.     Coordination: Coordination normal.  Psychiatric:        Behavior: Behavior normal.     (all labs ordered are listed, but only abnormal results are displayed) Labs Reviewed  CBC WITH DIFFERENTIAL/PLATELET - Abnormal; Notable for the following components:      Result Value   Hemoglobin 11.7 (*)    All other components within normal limits  COMPREHENSIVE METABOLIC PANEL WITH GFR - Abnormal; Notable for the following components:   Potassium 3.3 (*)    Glucose, Bld 161 (*)    Total Protein 6.1 (*)    AST 42 (*)    Total Bilirubin 1.3 (*)    All other components within normal limits  PRO BRAIN NATRIURETIC PEPTIDE - Abnormal; Notable for the following components:   Pro Brain Natriuretic Peptide 2,349.0 (*)    All other components within normal limits  TROPONIN T, HIGH SENSITIVITY - Abnormal; Notable for the following components:   Troponin T High Sensitivity 23 (*)    All other components within normal limits  TROPONIN T, HIGH SENSITIVITY - Abnormal; Notable for the following components:   Troponin T High Sensitivity 20 (*)    All other components within normal limits    EKG: EKG Interpretation Date/Time:  Sunday February 07 2024 09:37:13 EST Ventricular Rate:  112 PR Interval:    QRS Duration:  74 QT Interval:  358 QTC Calculation: 489 R Axis:   64  Text Interpretation: Atrial fibrillation Ventricular premature complex Borderline repol abnormality, diffuse leads Borderline prolonged QT interval Confirmed by Suzette Pac [phone]) on 02/07/2024 12:00:02 PM  Radiology: No results found.   Procedures   Medications Ordered in the ED  furosemide  (LASIX )  injection 40 mg (40 mg Intravenous Given 02/07/24 1100)  diltiazem  (CARDIZEM ) tablet 30 mg (30 mg Oral Given 02/07/24 1222)  potassium chloride  SA (KLOR-CON  M) CR tablet 40 mEq (40 mEq Oral Given 02/07/24 1449)                                    Medical Decision Making Amount and/or Complexity of Data Reviewed Labs: ordered. Radiology: ordered.  Risk Prescription drug management.   Patient had congestive heart failure, mild, she improved with Lasix  IV  and will increase her Lasix  at home and follow-up with cardiology and her primary care     Final diagnoses:  Systolic congestive heart failure, unspecified HF chronicity [facility])    ED Discharge Orders          Ordered    Ambulatory referral to Cardiology       Comments: If you have not heard from the Cardiology office within the next 72 hours please call [phone].   02/07/24 1445               Suzette Pac, MD 02/09/24 1038

## 2024-02-10 ENCOUNTER — Other Ambulatory Visit: Payer: Self-pay | Admitting: Nurse Practitioner

## 2024-02-11 ENCOUNTER — Ambulatory Visit: Admitting: Cardiology

## 2024-02-11 NOTE — Progress Notes (Deleted)
 Clinical Summary Ms. Marich is a 89 y.o.female   Past Medical History:  Diagnosis Date   A-fib (HCC)    Allergic arthritis of shoulder region, right    Cerebrovascular disease    followed at [facility]   Coronary artery disease    LVEF  65%   Diabetes mellitus without complication (HCC)    Type II   GERD (gastroesophageal reflux disease)    Hyperlipidemia    Hypertension      Allergies  Allergen Reactions   Codeine Anxiety and Other (See Comments)    Jitters      Current Outpatient Medications  Medication Sig Dispense Refill   alendronate (FOSAMAX) 70 MG tablet Take 70 mg by mouth once a week. Take with a full glass of water  on an empty stomach.     apixaban (ELIQUIS) 5 MG TABS tablet Take 1 tablet by mouth 2 (two) times daily.     atorvastatin (LIPITOR) 20 MG tablet Take 20 mg by mouth at bedtime.     diltiazem  (CARDIZEM ) 30 MG tablet Take 1 tablet (30 mg total) by mouth 2 (two) times daily as needed (Palpitations). 180 tablet 0   furosemide  (LASIX ) 20 MG tablet Take 20 mg by mouth daily.     glimepiride (AMARYL) 1 MG tablet Take 1 mg by mouth daily.     losartan (COZAAR) 25 MG tablet Take 25 mg by mouth daily.     metFORMIN (GLUCOPHAGE) 1000 MG tablet Take 1,000 mg by mouth 2 (two) times daily with a meal.      metoprolol  succinate (TOPROL -XL) 100 MG 24 hr tablet Take 100 mg by mouth every morning.     nitroGLYCERIN  (NITROSTAT ) 0.4 MG SL tablet Place 1 tablet (0.4 mg total) under the tongue every 5 (five) minutes x 3 doses as needed for chest pain (if no relief after 3rd does proceed to ED or call 911). 25 tablet 2   PROCTO-MED HC  2.5 % rectal cream INSERT 1 APPLICATION RECTALLY TWICE DAILY 28 g 1   spironolactone (ALDACTONE) 25 MG tablet Take 25 mg by mouth daily.     No current facility-administered medications for this visit.     Past Surgical History:  Procedure Laterality Date   APPENDECTOMY     BIOPSY  01/22/2023   Procedure: BIOPSY;  Surgeon: Cinderella Deatrice FALCON, MD;  [facility];  Service: Endoscopy;;   CARDIAC CATHETERIZATION     CAROTID ENDARTERECTOMY     left   CAROTID STENT INSERTION Left    CATARACT EXTRACTION W/PHACO Right 11/30/2012   Procedure: CATARACT EXTRACTION PHACO AND INTRAOCULAR LENS PLACEMENT (IOC);  Surgeon: Oneil T. Roz, MD;  Location: [facility];  Service: Ophthalmology;  Laterality: Right;  CDE:12.71   CATARACT EXTRACTION W/PHACO Left 12/21/2012   Procedure: CATARACT EXTRACTION PHACO AND INTRAOCULAR LENS PLACEMENT (IOC);  Surgeon: Oneil T. Roz, MD;  Location: [facility];  Service: Ophthalmology;  Laterality: Left;  CDE:12.65   CHOLECYSTECTOMY     COLONOSCOPY N/A 06/29/2012   Procedure: COLONOSCOPY;  Surgeon: Claudis RAYMOND Rivet, MD;  [facility];  Service: Endoscopy;  Laterality: N/A;  1200-moved to 1215 [name] to notify pt   COLONOSCOPY WITH PROPOFOL  N/A 01/22/2023   Procedure: COLONOSCOPY WITH PROPOFOL ;  Surgeon: Cinderella Deatrice FALCON, MD;  [facility];  Service: Endoscopy;  Laterality: N/A;  9:30AM;ASA 3   ESOPHAGOGASTRODUODENOSCOPY (EGD) WITH PROPOFOL  N/A 01/22/2023   Procedure: ESOPHAGOGASTRODUODENOSCOPY (EGD) WITH PROPOFOL ;  Surgeon: Cinderella Deatrice FALCON,  MD;  [facility];  Service: Endoscopy;  Laterality: N/A;  9:30AM;ASA 3   POLYPECTOMY  01/22/2023   Procedure: POLYPECTOMY;  Surgeon: Cinderella Deatrice FALCON, MD;  [facility];  Service: Endoscopy;;   REVERSE SHOULDER ARTHROPLASTY Right 09/03/2017   Procedure: REVERSE RIGHT SHOULDER ARTHROPLASTY;  Surgeon: Melita Drivers, MD;  [facility];  Service: Orthopedics;  Laterality: Right;   YAG LASER APPLICATION Right 07/03/2015   Procedure: YAG LASER APPLICATION;  Surgeon: Oneil Platts, MD;  [facility]: [facility];  Service: Ophthalmology;  Laterality: Right;   YAG LASER APPLICATION Left 07/17/2015   Procedure: YAG LASER APPLICATION;  Surgeon: Oneil Platts, MD;  Location: [facility];  Service: Ophthalmology;  Laterality: Left;     Allergies   Allergen Reactions   Codeine Anxiety and Other (See Comments)    Jitters       Family History  Problem Relation Age of Onset   Heart disease Mother    Cerebrovascular Disease Father    Cancer Sister    Cerebrovascular Disease Brother    Diabetes Brother    Heart attack Brother    Liver disease Brother      Social History Ms. Cieslinski reports that she has never smoked. She has never used smokeless tobacco. Ms. Moss reports no history of alcohol use.   Review of Systems CONSTITUTIONAL: No weight loss, fever, chills, weakness or fatigue.  HEENT: Eyes: No visual loss, blurred vision, double vision or yellow sclerae.No hearing loss, sneezing, congestion, runny nose or sore throat.  SKIN: No rash or itching.  CARDIOVASCULAR:  RESPIRATORY: No shortness of breath, cough or sputum.  GASTROINTESTINAL: No anorexia, nausea, vomiting or diarrhea. No abdominal pain or blood.  GENITOURINARY: No burning on urination, no polyuria NEUROLOGICAL: No headache, dizziness, syncope, paralysis, ataxia, numbness or tingling in the extremities. No change in bowel or bladder control.  MUSCULOSKELETAL: No muscle, back pain, joint pain or stiffness.  LYMPHATICS: No enlarged nodes. No history of splenectomy.  PSYCHIATRIC: No history of depression or anxiety.  ENDOCRINOLOGIC: No reports of sweating, cold or heat intolerance. No polyuria or polydipsia.  SABRA   Physical Examination There were no vitals filed for this visit. There were no vitals filed for this visit.  Gen: resting comfortably, no acute distress HEENT: no scleral icterus, pupils equal round and reactive, no palptable cervical adenopathy,  CV Resp: Clear to auscultation bilaterally GI: abdomen is soft, non-tender, non-distended, normal bowel sounds, no hepatosplenomegaly MSK: extremities are warm, no edema.  Skin: warm, no rash Neuro:  no focal deficits Psych: appropriate affect   Diagnostic Studies     Assessment and Plan         Dorn PHEBE Ross, M.D., F.A.C.C.

## 2024-02-15 ENCOUNTER — Ambulatory Visit: Admitting: Nurse Practitioner

## 2024-03-01 ENCOUNTER — Ambulatory Visit: Admitting: Nurse Practitioner

## 2024-03-02 ENCOUNTER — Ambulatory Visit: Admitting: Nurse Practitioner

## 2024-03-17 ENCOUNTER — Telehealth: Payer: Self-pay | Admitting: Cardiology

## 2024-03-17 NOTE — Telephone Encounter (Signed)
 Pt c/o medication issue:  1. Name of Medication:   apixaban (ELIQUIS) 5 MG TABS tablet    2. How are you currently taking this medication (dosage and times per day)?   Take 1 tablet by mouth 2 (two) times daily.    3. Are you having a reaction (difficulty breathing--STAT)? No   4. What is your medication issue? Pt paid $260 for Eliquis and is asking the office contact her [employer] so she can get it for cheaper. Daughter was driving when she called and will c/b tomorrow with the  number. Please advise.

## 2024-03-18 NOTE — Telephone Encounter (Signed)
 Spoke with patient advised her that her medication issue has been sent to our Medication Assistance team and once we have heard back she will be contacted. Patient verbalized understanding

## 2024-03-18 NOTE — Telephone Encounter (Signed)
Returned call to daughter. No answer. Left msg to call back.  

## 2024-03-18 NOTE — Telephone Encounter (Signed)
 Pt daughter calling for update . Please advise.

## 2024-03-21 ENCOUNTER — Other Ambulatory Visit (HOSPITAL_COMMUNITY): Payer: Self-pay

## 2024-03-21 ENCOUNTER — Telehealth: Payer: Self-pay | Admitting: Pharmacy Technician

## 2024-03-21 NOTE — Telephone Encounter (Signed)
" ° °  Sent message to pharmd pool to see if she has cardiomyopathy for a grant. Calling [employer] would not make it cheaper, tier exceptions are denied for eliquis.   Patient has a 450.00 deductible then cost goes down. For xarelto it would be 5.73 after deductible met. Eliquis too soon to show what that copay would be.   "

## 2024-03-23 ENCOUNTER — Other Ambulatory Visit (HOSPITAL_COMMUNITY): Payer: Self-pay

## 2024-03-23 NOTE — Telephone Encounter (Signed)
 SABRA

## 2024-03-25 NOTE — Telephone Encounter (Signed)
 Patient has a 450 dollar deductible on her plan. Next fill will also be over 200 dollars and after that her deductible will be satisfied

## 2024-03-28 NOTE — Telephone Encounter (Signed)
 Patient Advocate Encounter   The patient was approved for a [employer] grant that will help cover the cost of eliquis Total amount awarded, 7500.  Effective: 02/27/24 - 02/25/25   APW:[id number] ERW:[id number] Hmnle:[id number] PI:[id number] [employer] ID: [id number]   Pharmacy provided with approval and processing information. Patient informed via telephone   [facility]

## 2024-04-15 ENCOUNTER — Ambulatory Visit: Admitting: Cardiology

## 2024-04-15 ENCOUNTER — Encounter: Payer: Self-pay | Admitting: Cardiology

## 2024-04-15 VITALS — BP 118/76 | HR 67 | Ht 66.0 in | Wt 150.4 lb

## 2024-04-15 DIAGNOSIS — E782 Mixed hyperlipidemia: Secondary | ICD-10-CM

## 2024-04-15 DIAGNOSIS — I5032 Chronic diastolic (congestive) heart failure: Secondary | ICD-10-CM

## 2024-04-15 DIAGNOSIS — I10 Essential (primary) hypertension: Secondary | ICD-10-CM

## 2024-04-15 DIAGNOSIS — I4811 Longstanding persistent atrial fibrillation: Secondary | ICD-10-CM

## 2024-04-15 MED ORDER — FUROSEMIDE 20 MG PO TABS: ORAL_TABLET | ORAL | 3 refills | Status: AC

## 2024-04-15 NOTE — Progress Notes (Signed)
 "     Clinical Summary Jill Fisher is a 89 y.o.female seen today for follow up of the following medical problems.   Previously followed by [facility] cardiolgohy  1.HFpEF - 11/2022 echo: LVEF 50%, indet diastolic, normal RV, mild MR, normal LA -01/2024 ER visit with signs of fluid overload, improved with IV lasix . proBNP 2349, CXR no acute process. Home lasix  was increased and discharged from ER, lasix  was increased to 40mg  daily.   - from [facility] notes jardiance was too expensive -back on lasix  20mg  daily. Mild swelling at times. SOB at times. Some orthopnea, chronically sleeps with her bed at angle.    2.Long standing Persistent Afib - from [facility] notes had been on toprol  up to 100mg  bid - occasional palpitations, though quiet recently. Will take her prn diltiazem .  - no bleeding on eliquis   3. PSVT - rare palpitations  4. HTN - compliant with meds  5. HLD - recent labs with pcp  Past Medical History:  Diagnosis Date   A-fib (HCC)    Allergic arthritis of shoulder region, right    Cardiomyopathy (HCC)    Cerebrovascular disease    followed at [facility]   Coronary artery disease    LVEF  65%   Diabetes mellitus without complication (HCC)    Type II   GERD (gastroesophageal reflux disease)    Hyperlipidemia    Hypertension      Allergies[1]   Current Outpatient Medications  Medication Sig Dispense Refill   alendronate (FOSAMAX) 70 MG tablet Take 70 mg by mouth once a week. Take with a full glass of water  on an empty stomach.     apixaban (ELIQUIS) 5 MG TABS tablet Take 1 tablet by mouth 2 (two) times daily.     atorvastatin (LIPITOR) 20 MG tablet Take 20 mg by mouth at bedtime.     diltiazem  (CARDIZEM ) 30 MG tablet TAKE 1 TABLET BY MOUTH TWICE DAILY AS NEEDED 180 tablet 2   furosemide  (LASIX ) 20 MG tablet Take 20 mg by mouth daily.     glimepiride (AMARYL) 1 MG tablet Take 1 mg by mouth daily.     losartan (COZAAR) 25 MG tablet Take 25 mg by mouth daily.     metFORMIN  (GLUCOPHAGE) 1000 MG tablet Take 1,000 mg by mouth 2 (two) times daily with a meal.      metoprolol  succinate (TOPROL -XL) 100 MG 24 hr tablet Take 100 mg by mouth every morning.     nitroGLYCERIN  (NITROSTAT ) 0.4 MG SL tablet Place 1 tablet (0.4 mg total) under the tongue every 5 (five) minutes x 3 doses as needed for chest pain (if no relief after 3rd does proceed to ED or call 911). 25 tablet 2   PROCTO-MED HC  2.5 % rectal cream INSERT 1 APPLICATION RECTALLY TWICE DAILY 28 g 1   spironolactone (ALDACTONE) 25 MG tablet Take 25 mg by mouth daily.     No current facility-administered medications for this visit.     Past Surgical History:  Procedure Laterality Date   APPENDECTOMY     BIOPSY  01/22/2023   Procedure: BIOPSY;  Surgeon: Cinderella Deatrice FALCON, MD;  [facility];  Service: Endoscopy;;   CARDIAC CATHETERIZATION     CAROTID ENDARTERECTOMY     left   CAROTID STENT INSERTION Left    CATARACT EXTRACTION W/PHACO Right 11/30/2012   Procedure: CATARACT EXTRACTION PHACO AND INTRAOCULAR LENS PLACEMENT (IOC);  Surgeon: Oneil T. Roz, MD;  [facility]: [facility];  Service:  Ophthalmology;  Laterality: Right;  CDE:12.71   CATARACT EXTRACTION W/PHACO Left 12/21/2012   Procedure: CATARACT EXTRACTION PHACO AND INTRAOCULAR LENS PLACEMENT (IOC);  Surgeon: Oneil T. Roz, MD;  Location: [facility];  Service: Ophthalmology;  Laterality: Left;  CDE:12.65   CHOLECYSTECTOMY     COLONOSCOPY N/A 06/29/2012   Procedure: COLONOSCOPY;  Surgeon: Claudis RAYMOND Rivet, MD;  [facility];  Service: Endoscopy;  Laterality: N/A;  1200-moved to 1215 [name] to notify pt   COLONOSCOPY WITH PROPOFOL  N/A 01/22/2023   Procedure: COLONOSCOPY WITH PROPOFOL ;  Surgeon: Cinderella Deatrice FALCON, MD;  [facility];  Service: Endoscopy;  Laterality: N/A;  9:30AM;ASA 3   ESOPHAGOGASTRODUODENOSCOPY (EGD) WITH PROPOFOL  N/A 01/22/2023   Procedure: ESOPHAGOGASTRODUODENOSCOPY (EGD) WITH PROPOFOL ;  Surgeon: Cinderella Deatrice FALCON,  MD;  [facility];  Service: Endoscopy;  Laterality: N/A;  9:30AM;ASA 3   POLYPECTOMY  01/22/2023   Procedure: POLYPECTOMY;  Surgeon: Cinderella Deatrice FALCON, MD;  [facility];  Service: Endoscopy;;   REVERSE SHOULDER ARTHROPLASTY Right 09/03/2017   Procedure: REVERSE RIGHT SHOULDER ARTHROPLASTY;  Surgeon: Melita Drivers, MD;  [facility]: [facility];  Service: Orthopedics;  Laterality: Right;   YAG LASER APPLICATION Right 07/03/2015   Procedure: YAG LASER APPLICATION;  Surgeon: Oneil Roz, MD;  [facility]: [facility];  Service: Ophthalmology;  Laterality: Right;   YAG LASER APPLICATION Left 07/17/2015   Procedure: YAG LASER APPLICATION;  Surgeon: Oneil Roz, MD;  [facility]: [facility];  Service: Ophthalmology;  Laterality: Left;     Allergies[2]    Family History  Problem Relation Age of Onset   Heart disease Mother    Cerebrovascular Disease Father    Cancer Sister    Cerebrovascular Disease Brother    Diabetes Brother    Heart attack Brother    Liver disease Brother      Social History Ms. Bettendorf reports that she has never smoked. She has never used smokeless tobacco. Ms. Tandy reports no history of alcohol use.     Physical Examination Today's Vitals   04/15/24 0935  BP: 118/76  Pulse: 67  SpO2: 98%  Weight: 150 lb 6.4 oz (68.2 kg)  Height: 5' 6 (1.676 m)  PainSc: 0-No pain   Body mass index is 24.28 kg/m.  Gen: resting comfortably, no acute distress HEENT: no scleral icterus, pupils equal round and reactive, no palptable cervical adenopathy,  CV: irreg, no mrg, no jvd Resp: Clear to auscultation bilaterally GI: abdomen is soft, non-tender, non-distended, normal bowel sounds, no hepatosplenomegaly MSK: extremities are warm, no edema.  Skin: warm, no rash Neuro:  no focal deficits Psych: appropriate affect   Diagnostic Studies  07/2020 echo [facility] Echo: 07/2020 Summary 1. The left ventricle is normal in size with mildly increased wall thickness. 2. The left  ventricular systolic function is borderline, LVEF is visually estimated at 50%. 3. There is mild to moderate mitral valve regurgitation. 4. The left atrium is severely dilated in size. 5. The right ventricle is normal in size, with normal systolic function. 6. There is moderate to severe tricuspid regurgitation. 7. There is mild pulmonary hypertension, estimated pulmonary artery systolic pressure is 41 mmHg. 8. The right atrium is moderately dilated in size.   01/2021 [facility] monitor  Atrial Fibrillation occurred continuously (100% burden), ranging from 62- 201 bpm (avg of 105 bpm). Isolated VEs were occasional (1.7%, 17728), VE Couplets were rare (<1.0%, 153), and no VE Triplets were present. Ventricular Bigeminy and Trigeminy were present.  Final Interpretation  Agree with Findings. Needs better rate control (AVG HR 105 bpm). Electronically signed by Dr. Epimenio Assar 01/24/21 11:50 AM   Assessment and Plan  1.Chronic HFpEF - appears euvolemic today. At times at home can have swelling, increased LE edema - will change her lasix  to 20mg  daily can take additional 20mg  prn - SGLT2i was too expensive in the past  2. Longstanding persistent afib - undergoing rate control strategy, rates are well controlled today with toprol  100mg  daily. She has dilt 30mg  prn palpitations - continue current meds including eliquis for stroke prevention - EKG today shows rate controlled afib  3. HTN - at goal, continue current meds    4. HLD -request recent labs with pcp   F/u 6 months   Dorn PHEBE Ross, M.D.     [1]  Allergies Allergen Reactions   Codeine Anxiety and Other (See Comments)    Jitters   [2]  Allergies Allergen Reactions   Codeine Anxiety and Other (See Comments)    Jitters    "

## 2024-04-15 NOTE — Patient Instructions (Addendum)
 Medication Instructions:   Continue your Lasix  at 20mg  daily with the addition of taking an extra 20mg  as needed for swelling or shortness of breath  Continue all other medications.     Labwork:  none  Testing/Procedures:  none  Follow-Up:  6 months   Any Other Special Instructions Will Be Listed Below (If Applicable).   If you need a refill on your cardiac medications before your next appointment, please call your pharmacy.
